# Patient Record
Sex: Female | Born: 1945 | Race: White | Hispanic: No | Smoking: Former smoker
Health system: Southern US, Community
[De-identification: ages and names within clinical notes are randomized; demographics above are authoritative.]

## PROBLEM LIST (undated history)

## (undated) DIAGNOSIS — G43909 Migraine, unspecified, not intractable, without status migrainosus: Secondary | ICD-10-CM

## (undated) DIAGNOSIS — J45909 Unspecified asthma, uncomplicated: Secondary | ICD-10-CM

## (undated) DIAGNOSIS — Z9289 Personal history of other medical treatment: Secondary | ICD-10-CM

## (undated) DIAGNOSIS — E039 Hypothyroidism, unspecified: Secondary | ICD-10-CM

## (undated) HISTORY — PX: INCONTINENCE SURGERY: SHX676

## (undated) HISTORY — PX: TOTAL SHOULDER ARTHROPLASTY: SHX126

## (undated) HISTORY — DX: Unspecified asthma, uncomplicated: J45.909

## (undated) HISTORY — PX: BASAL CELL CARCINOMA EXCISION: SHX1214

## (undated) HISTORY — DX: Hypothyroidism, unspecified: E03.9

## (undated) HISTORY — DX: Migraine, unspecified, not intractable, without status migrainosus: G43.909

## (undated) HISTORY — DX: Personal history of other medical treatment: Z92.89

---

## 1991-12-11 DIAGNOSIS — Z90710 Acquired absence of both cervix and uterus: Secondary | ICD-10-CM

## 1991-12-11 HISTORY — DX: Acquired absence of both cervix and uterus: Z90.710

## 2011-12-11 DIAGNOSIS — Z96641 Presence of right artificial hip joint: Secondary | ICD-10-CM

## 2011-12-11 HISTORY — DX: Presence of right artificial hip joint: Z96.641

## 2013-12-10 DIAGNOSIS — Z9289 Personal history of other medical treatment: Secondary | ICD-10-CM

## 2013-12-10 HISTORY — DX: Personal history of other medical treatment: Z92.89

## 2014-12-10 DIAGNOSIS — Z9889 Other specified postprocedural states: Secondary | ICD-10-CM

## 2014-12-10 HISTORY — DX: Other specified postprocedural states: Z98.890

## 2016-12-10 DIAGNOSIS — Z96642 Presence of left artificial hip joint: Secondary | ICD-10-CM

## 2016-12-10 HISTORY — DX: Presence of left artificial hip joint: Z96.642

## 2019-12-11 DIAGNOSIS — Z9289 Personal history of other medical treatment: Secondary | ICD-10-CM

## 2019-12-11 HISTORY — DX: Personal history of other medical treatment: Z92.89

## 2021-01-05 ENCOUNTER — Ambulatory Visit: Payer: Self-pay | Admitting: Internal Medicine

## 2021-04-06 ENCOUNTER — Other Ambulatory Visit: Payer: Self-pay | Admitting: Family Medicine

## 2021-04-06 DIAGNOSIS — Z1231 Encounter for screening mammogram for malignant neoplasm of breast: Secondary | ICD-10-CM

## 2021-04-17 ENCOUNTER — Ambulatory Visit: Payer: No Typology Code available for payment source | Admitting: Family Medicine

## 2021-04-19 ENCOUNTER — Encounter: Payer: Self-pay | Admitting: Family Medicine

## 2021-04-19 ENCOUNTER — Other Ambulatory Visit: Payer: Self-pay

## 2021-04-19 ENCOUNTER — Ambulatory Visit (INDEPENDENT_AMBULATORY_CARE_PROVIDER_SITE_OTHER): Payer: No Typology Code available for payment source | Admitting: Family Medicine

## 2021-04-19 VITALS — BP 132/82 | Ht 62.0 in | Wt 157.0 lb

## 2021-04-19 DIAGNOSIS — M1711 Unilateral primary osteoarthritis, right knee: Secondary | ICD-10-CM

## 2021-04-19 NOTE — Progress Notes (Signed)
PCP: Sigmund Hazel, MD  Subjective:   HPI: Patient is a 75 y.o. female here for right knee pain.  Patient reports she's had off and on right knee pain for years. Pain is medial. Likes to walk for exercise and does so daily. Pain is an achiness and notices this after sleep also. No benefit from recent steroid injection. Years ago had viscosupplementation which helped - here for consideration of this.  Past Medical History:  Diagnosis Date  . Asthma   . History of bone density study 2015   [facility]  . History of colonoscopy 2016  . History of hysterectomy 1993   W.Ratchford, MD.  . History of left hip replacement 2018   Osvaldo Shipper III, MD  . History of mammogram 2021   [facility]  . History of Papanicolaou smear of cervix    2021  . History of right hip replacement 2013   Mal Amabile, MD  . Hypothyroid   . Migraine syndrome     Current Outpatient Medications on File Prior to Visit  Medication Sig Dispense Refill  . amLODipine (NORVASC) 5 MG tablet Take 5 mg by mouth daily.    . Ascorbic Acid (VITAMIN C PO) Take 5,000 mg by mouth daily.    . Cholecalciferol (VITAMIN D3 PO) Take 1,000 Units by mouth daily.    . Coenzyme Q10 (COQ10) 100 MG CAPS Take 1 capsule by mouth daily.    Marland Kitchen erythromycin (ERY-TAB) 250 MG EC tablet Take 250 mg by mouth as needed. Dental Procedures.    Marland Kitchen estradiol (ESTRACE) 0.1 MG/GM vaginal cream Place 1 Applicatorful vaginally.    Marland Kitchen estradiol (VIVELLE-DOT) 0.05 MG/24HR patch Place 1 patch onto the skin.     . Flaxseed, Linseed, (FLAXSEED OIL PO) Take 1,000 mg by mouth daily.    Marland Kitchen levothyroxine (SYNTHROID) 75 MCG tablet Take 75 mcg by mouth daily.    . Magnesium 100 MG CAPS Take 1 capsule by mouth daily.    . montelukast (SINGULAIR) 10 MG tablet Take 10 mg by mouth at bedtime.    . SUMAtriptan (IMITREX) 100 MG tablet Take 100 mg by mouth as needed for migraine. May repeat in 2 hours if headache persists or  recurs.    . Tiotropium Bromide Monohydrate (SPIRIVA HANDIHALER IN) Inhale 1 capsule into the lungs daily.    . Vitamins-Lipotropics (BALANCE B-100 PO) Take 1 capsule by mouth daily.     No current facility-administered medications on file prior to visit.    History reviewed. No pertinent surgical history.  Allergies  Allergen Reactions  . Ace Inhibitors     Lip Numbness, and Face Numbness.  . Codeine     Dizziness, Imbalance.  . Flagyl [Metronidazole]     Dizziness.  Marland Kitchen Penicillins Hives  . Sulfa Antibiotics Hives  . Tetracyclines & Related     Lip numbness.    Social History   Socioeconomic History  . Marital status: Divorced    Spouse name: Not on file  . Number of children: Not on file  . Years of education: Not on file  . Highest education level: Not on file  Occupational History  . Not on file  Tobacco Use  . Smoking status: Former Smoker    Packs/day: 1.50    Types: Cigarettes  . Smokeless tobacco: Never Used  Substance and Sexual Activity  . Alcohol use: Yes    Alcohol/week: 14.0 standard drinks    Types: 14 Glasses of wine per  week    Comment: 25oz of red wine per evening.  . Drug use: Never  . Sexual activity: Not on file  Other Topics Concern  . Not on file  Social History Narrative   Tobacco use, amount per day now: None.   Past tobacco use, amount per day: 1.5 pack.   How many years did you use tobacco: 3   Alcohol use (drinks per week): 14 drinks per week. 25oz of red wine per evening.   Diet: unsure what you're asking.   Do you drink/eat things with caffeine: Very little, Decaf coffee in AM. Occasional tea.   Marital status: Divorced.                                 What year were you married? 1980.   Do you live in a house, apartment, assisted living, condo, trailer, etc.? House.   Is it one or more stories? No - one floor.   How many persons live in your home? Myself.   Do you have pets in your home?( please list) 2 Terrier Mix Dogs, and 1  Cat.   Highest Level of education completed? PhD   Current or past profession: Counsellor.   Do you exercise?  Yes.                                Type and how often? Walking Daily, 7.000 steps per day.   Do you have a living will? Yes.   Do you have a DNR form?  No                                 If not, do you want to discuss one? Yes   Do you have signed POA/HPOA forms? Yes                       If so, please bring to you appointment      Do you have any difficulty bathing or dressing yourself? No.   Do you have any difficulty preparing food or eating? No.   Do you have any difficulty managing your medications? No.   Do you have any difficulty managing your finances? No.   Do you have any difficulty affording your medications? No.   Social Determinants of Health   Financial Resource Strain: Not on file  Food Insecurity: Not on file  Transportation Needs: Not on file  Physical Activity: Not on file  Stress: Not on file  Social Connections: Not on file  Intimate Partner Violence: Not on file    Family History  Problem Relation Age of Onset  . Stroke Father   . Kidney failure Brother   . Heart failure Brother   . Kidney failure Daughter   . Heart attack Paternal Uncle   . Dementia Maternal Grandmother   . Cancer Paternal Grandmother     BP 132/82   Ht 5\' 2"  (1.575 m)   Wt 157 lb (71.2 kg)   BMI 28.72 kg/m   No flowsheet data found.  No flowsheet data found.  Review of Systems: See HPI above.     Objective:  Physical Exam:  Gen: NAD, comfortable in exam room  Right knee: No gross deformity, ecchymoses.  Mild effusion. TTP medial joint line.  No other tenderness. FROM with normal strength. Negative ant/post drawers. Negative valgus/varus testing. Negative lachman.  Negative mcmurrays, apleys.  NV intact distally.   Assessment & Plan:  1. Right knee pain - outside radiographs confirmed arthritis.  Exam consistent with this as cause of pain.   Steroid injection without benefit.  Will go ahead with viscosupplementation and call patient when approved.  OTC medications, topicals if needed.

## 2021-05-22 ENCOUNTER — Other Ambulatory Visit: Payer: Self-pay

## 2021-05-22 ENCOUNTER — Ambulatory Visit (INDEPENDENT_AMBULATORY_CARE_PROVIDER_SITE_OTHER): Payer: No Typology Code available for payment source | Admitting: Family Medicine

## 2021-05-22 ENCOUNTER — Encounter: Payer: Self-pay | Admitting: Family Medicine

## 2021-05-22 DIAGNOSIS — M1711 Unilateral primary osteoarthritis, right knee: Secondary | ICD-10-CM

## 2021-05-22 NOTE — Progress Notes (Signed)
PCP: Sigmund Hazel, MD  Subjective:   HPI: Patient is a Stephanie Brennan here for right knee pain.  5/11: Patient reports she's had off and on right knee pain for years. Pain is medial. Likes to walk for exercise and does so daily. Pain is an achiness and notices this after sleep also. No benefit from recent steroid injection. Years ago had viscosupplementation which helped - here for consideration of this.  6/13: Patient here to start orthovisc series.  Past Medical History:  Diagnosis Date   Asthma    History of bone density study 2015   [facility]   History of colonoscopy 2016   History of hysterectomy 1993   W.Ratchford, MD.   History of left hip replacement 2018   Osvaldo Shipper III, MD   History of mammogram 2021   [facility]   History of Papanicolaou smear of cervix    2021   History of right hip replacement 2013   Mal Amabile, MD   Hypothyroid    Migraine syndrome     Current Outpatient Medications on File Prior to Visit  Medication Sig Dispense Refill   amLODipine (NORVASC) 5 MG tablet Take 5 mg by mouth daily.     Ascorbic Acid (VITAMIN C PO) Take 5,000 mg by mouth daily.     Cholecalciferol (VITAMIN D3 PO) Take 1,000 Units by mouth daily.     Coenzyme Q10 (COQ10) 100 MG CAPS Take 1 capsule by mouth daily.     erythromycin (ERY-TAB) 250 MG EC tablet Take 250 mg by mouth as needed. Dental Procedures.     estradiol (ESTRACE) 0.1 MG/GM vaginal cream Place 1 Applicatorful vaginally.     estradiol (VIVELLE-DOT) 0.05 MG/24HR patch Place 1 patch onto the skin.      Flaxseed, Linseed, (FLAXSEED OIL PO) Take 1,000 mg by mouth daily.     levothyroxine (SYNTHROID) 75 MCG tablet Take 75 mcg by mouth daily.     Magnesium 100 MG CAPS Take 1 capsule by mouth daily.     montelukast (SINGULAIR) 10 MG tablet Take 10 mg by mouth at bedtime.     SUMAtriptan (IMITREX) 100 MG tablet Take 100 mg by mouth as needed for migraine. May repeat  in 2 hours if headache persists or recurs.     Tiotropium Bromide Monohydrate (SPIRIVA HANDIHALER IN) Inhale 1 capsule into the lungs daily.     Vitamins-Lipotropics (BALANCE B-100 PO) Take 1 capsule by mouth daily.     No current facility-administered medications on file prior to visit.    History reviewed. No pertinent surgical history.  Allergies  Allergen Reactions   Ace Inhibitors     Lip Numbness, and Face Numbness.   Codeine     Dizziness, Imbalance.   Flagyl [Metronidazole]     Dizziness.   Penicillins Hives   Sulfa Antibiotics Hives   Tetracyclines & Related     Lip numbness.    Social History   Socioeconomic History   Marital status: Divorced    Spouse name: Not on file   Number of children: Not on file   Years of education: Not on file   Highest education level: Not on file  Occupational History   Not on file  Tobacco Use   Smoking status: Former    Packs/day: 1.50    Pack years: 0.00    Types: Cigarettes   Smokeless tobacco: Never  Substance and Sexual Activity   Alcohol use: Yes  Alcohol/week: 14.0 standard drinks    Types: 14 Glasses of wine per week    Comment: 25oz of red wine per evening.   Drug use: Never   Sexual activity: Not on file  Other Topics Concern   Not on file  Social History Narrative   Tobacco use, amount per day now: None.   Past tobacco use, amount per day: 1.5 pack.   How many years did you use tobacco: 3   Alcohol use (drinks per week): 14 drinks per week. 25oz of red wine per evening.   Diet: unsure what you're asking.   Do you drink/eat things with caffeine: Very little, Decaf coffee in AM. Occasional tea.   Marital status: Divorced.                                 What year were you married? 1980.   Do you live in a house, apartment, assisted living, condo, trailer, etc.? House.   Is it one or more stories? No - one floor.   How many persons live in your home? Myself.   Do you have pets in your home?( please list) 2  Terrier Mix Dogs, and 1 Cat.   Highest Level of education completed? PhD   Current or past profession: Counsellor.   Do you exercise?  Yes.                                Type and how often? Walking Daily, 7.000 steps per day.   Do you have a living will? Yes.   Do you have a DNR form?  No                                 If not, do you want to discuss one? Yes   Do you have signed POA/HPOA forms? Yes                       If so, please bring to you appointment      Do you have any difficulty bathing or dressing yourself? No.   Do you have any difficulty preparing food or eating? No.   Do you have any difficulty managing your medications? No.   Do you have any difficulty managing your finances? No.   Do you have any difficulty affording your medications? No.   Social Determinants of Health   Financial Resource Strain: Not on file  Food Insecurity: Not on file  Transportation Needs: Not on file  Physical Activity: Not on file  Stress: Not on file  Social Connections: Not on file  Intimate Partner Violence: Not on file    Family History  Problem Relation Age of Onset   Stroke Father    Kidney failure Brother    Heart failure Brother    Kidney failure Daughter    Heart attack Paternal Uncle    Dementia Maternal Grandmother    Cancer Paternal Grandmother     BP (!) 150/73   Ht 5\' 2"  (1.575 m)   Wt 155 lb (70.3 kg)   BMI 28.35 kg/m   No flowsheet data found.  No flowsheet data found.  Review of Systems: See HPI above.     Objective:  Physical Exam:  Gen: NAD, comfortable in exam room  Remainder of exam not repeated today.  Right knee: No gross deformity, ecchymoses.  Mild effusion. TTP medial joint line.  No other tenderness. FROM with normal strength. Negative ant/post drawers. Negative valgus/varus testing. Negative lachman.  Negative mcmurrays, apleys.  NV intact distally.   Assessment & Plan:  1. Right knee pain - 2/2 arthritis.  Orthovisc  series started today.  F/u in 1 week for second injection.  After informed written consent timeout was performed, patient was seated on exam table. Right knee was prepped with alcohol swab and utilizing anteromedial approach, patient's right knee was injected intraarticularly with 30mL lidocaine followed by orthovisc. Patient tolerated the procedure well without immediate complications.

## 2021-05-30 ENCOUNTER — Other Ambulatory Visit: Payer: Self-pay

## 2021-05-30 ENCOUNTER — Ambulatory Visit
Admission: RE | Admit: 2021-05-30 | Discharge: 2021-05-30 | Disposition: A | Discharge status: Home / Self Care | Payer: No Typology Code available for payment source | Source: Ambulatory Visit | Attending: Family Medicine | Admitting: Family Medicine

## 2021-05-30 DIAGNOSIS — Z1231 Encounter for screening mammogram for malignant neoplasm of breast: Secondary | ICD-10-CM

## 2021-05-31 ENCOUNTER — Ambulatory Visit (INDEPENDENT_AMBULATORY_CARE_PROVIDER_SITE_OTHER): Payer: No Typology Code available for payment source | Admitting: Family Medicine

## 2021-05-31 ENCOUNTER — Ambulatory Visit: Payer: No Typology Code available for payment source | Admitting: Family Medicine

## 2021-05-31 ENCOUNTER — Encounter: Payer: Self-pay | Admitting: Family Medicine

## 2021-05-31 DIAGNOSIS — M1711 Unilateral primary osteoarthritis, right knee: Secondary | ICD-10-CM

## 2021-05-31 NOTE — Progress Notes (Signed)
Stephanie Brennan presents for her second of 3 Orthovisc injections.  She had her first last week with Dr. Pearletha Forge, reports that she is doing fairly well.  She did overdo it a bit in the garden after her last visit and had some increased soreness after that.  No side effects from injection, no new concerns.  Procedure note: After informed written consent timeout was performed, patient was seated on exam table. Right knee was prepped with alcohol swab and utilizing ultrasound-guided suprapatellar approach, patient's right knee was anesthetized with 3 cc of lidocaine and injected intraarticularly with preloaded Orthovisc syringe. Patient tolerated the procedure well without immediate complications.  Please see associated documentation for ultrasound images.  Guy Sandifer, MD Sports Medicine Fellow, [facility]   I was the preceptor for this visit and available for immediate consultation Marsa Aris, DO

## 2021-06-05 ENCOUNTER — Other Ambulatory Visit: Payer: Self-pay

## 2021-06-05 ENCOUNTER — Ambulatory Visit (INDEPENDENT_AMBULATORY_CARE_PROVIDER_SITE_OTHER): Payer: No Typology Code available for payment source | Admitting: Family Medicine

## 2021-06-05 DIAGNOSIS — M1711 Unilateral primary osteoarthritis, right knee: Secondary | ICD-10-CM | POA: Diagnosis not present

## 2021-06-06 ENCOUNTER — Encounter: Payer: Self-pay | Admitting: Family Medicine

## 2021-06-06 NOTE — Progress Notes (Signed)
PCP: Sigmund Hazel, MD  Subjective:   HPI: Patient is a 75 y.o. female here for right knee pain.  5/11: Patient reports she's had off and on right knee pain for years. Pain is medial. Likes to walk for exercise and does so daily. Pain is an achiness and notices this after sleep also. No benefit from recent steroid injection. Years ago had viscosupplementation which helped - here for consideration of this.  6/13: Patient here to start orthovisc series.  6/22: Patient given second orthovisc injection  6/27: Patient returns for third orthovisc injection.  Past Medical History:  Diagnosis Date   Asthma    History of bone density study 2015   [facility]   History of colonoscopy 2016   History of hysterectomy 1993   W.Ratchford, MD.   History of left hip replacement 2018   Osvaldo Shipper III, MD   History of mammogram 2021   [facility]   History of Papanicolaou smear of cervix    2021   History of right hip replacement 2013   Mal Amabile, MD   Hypothyroid    Migraine syndrome     Current Outpatient Medications on File Prior to Visit  Medication Sig Dispense Refill   amlodipine-atorvastatin (CADUET) 2.5-10 MG tablet      amLODipine (NORVASC) 5 MG tablet Take 5 mg by mouth daily.     Ascorbic Acid (VITAMIN C PO) Take 5,000 mg by mouth daily.     Cholecalciferol (VITAMIN D3 PO) Take 1,000 Units by mouth daily.     Coenzyme Q10 (COQ10) 100 MG CAPS Take 1 capsule by mouth daily.     erythromycin (ERY-TAB) 250 MG EC tablet Take 250 mg by mouth as needed. Dental Procedures.     estradiol (ESTRACE) 0.1 MG/GM vaginal cream Place 1 Applicatorful vaginally.     estradiol (VIVELLE-DOT) 0.025 MG/24HR Place onto the skin.     Flaxseed, Linseed, (FLAXSEED OIL PO) Take 1,000 mg by mouth daily.     levothyroxine (SYNTHROID) 75 MCG tablet Take 75 mcg by mouth daily.     Magnesium 100 MG CAPS Take 1 capsule by mouth daily.     montelukast  (SINGULAIR) 10 MG tablet Take 10 mg by mouth at bedtime.     SUMAtriptan (IMITREX) 100 MG tablet Take 100 mg by mouth as needed for migraine. May repeat in 2 hours if headache persists or recurs.     Tiotropium Bromide Monohydrate (SPIRIVA HANDIHALER IN) Inhale 1 capsule into the lungs daily.     Vitamins-Lipotropics (BALANCE B-100 PO) Take 1 capsule by mouth daily.     No current facility-administered medications on file prior to visit.    History reviewed. No pertinent surgical history.  Allergies  Allergen Reactions   Ace Inhibitors     Lip Numbness, and Face Numbness.   Codeine     Dizziness, Imbalance.   Flagyl [Metronidazole]     Dizziness.   Penicillins Hives   Sulfa Antibiotics Hives   Tetracyclines & Related     Lip numbness.    Social History   Socioeconomic History   Marital status: Divorced    Spouse name: Not on file   Number of children: Not on file   Years of education: Not on file   Highest education level: Not on file  Occupational History   Not on file  Tobacco Use   Smoking status: Former    Packs/day: 1.50    Pack years: 0.00  Types: Cigarettes   Smokeless tobacco: Never  Substance and Sexual Activity   Alcohol use: Yes    Alcohol/week: 14.0 standard drinks    Types: 14 Glasses of wine per week    Comment: 25oz of red wine per evening.   Drug use: Never   Sexual activity: Not on file  Other Topics Concern   Not on file  Social History Narrative   Tobacco use, amount per day now: None.   Past tobacco use, amount per day: 1.5 pack.   How many years did you use tobacco: 3   Alcohol use (drinks per week): 14 drinks per week. 25oz of red wine per evening.   Diet: unsure what you're asking.   Do you drink/eat things with caffeine: Very little, Decaf coffee in AM. Occasional tea.   Marital status: Divorced.                                 What year were you married? 1980.   Do you live in a house, apartment, assisted living, condo, trailer,  etc.? House.   Is it one or more stories? No - one floor.   How many persons live in your home? Myself.   Do you have pets in your home?( please list) 2 Terrier Mix Dogs, and 1 Cat.   Highest Level of education completed? PhD   Current or past profession: Counsellor.   Do you exercise?  Yes.                                Type and how often? Walking Daily, 7.000 steps per day.   Do you have a living will? Yes.   Do you have a DNR form?  No                                 If not, do you want to discuss one? Yes   Do you have signed POA/HPOA forms? Yes                       If so, please bring to you appointment      Do you have any difficulty bathing or dressing yourself? No.   Do you have any difficulty preparing food or eating? No.   Do you have any difficulty managing your medications? No.   Do you have any difficulty managing your finances? No.   Do you have any difficulty affording your medications? No.   Social Determinants of Health   Financial Resource Strain: Not on file  Food Insecurity: Not on file  Transportation Needs: Not on file  Physical Activity: Not on file  Stress: Not on file  Social Connections: Not on file  Intimate Partner Violence: Not on file    Family History  Problem Relation Age of Onset   Stroke Father    Kidney failure Brother    Heart failure Brother    Kidney failure Daughter    Heart attack Paternal Uncle    Dementia Maternal Grandmother    Cancer Paternal Grandmother     Ht 5\' 2"  (1.575 m)   Wt 155 lb (70.3 kg)   BMI 28.35 kg/m   No flowsheet data found.  No flowsheet data found.  Review of Systems: See HPI above.  Objective:  Physical Exam:  Gen: NAD, comfortable in exam room  Remainder of exam not repeated today.  Right knee: No gross deformity, ecchymoses.  Mild effusion. TTP medial joint line.  No other tenderness. FROM with normal strength. Negative ant/post drawers. Negative valgus/varus testing.  Negative lachman.  Negative mcmurrays, apleys.  NV intact distally.   Assessment & Plan:  1. Right knee pain - 2/2 arthritis.  Third orthovisc injection given today.  Will hold the fourth injection for if not improving going forward.    After informed written consent timeout was performed, patient was seated on exam table. Right knee was prepped with alcohol swab and utilizing anteromedial approach, patient's right knee was injected intraarticularly with 32mL lidocaine followed by orthovisc. Patient tolerated the procedure well without immediate complications.

## 2021-06-23 NOTE — Progress Notes (Signed)
Patient referred by Kathyrn Lass, MD for palpitations  Subjective:   Stephanie Brennan, female    DOB: Oct 31, 1946, 75 y.o.   MRN: [id number]   Chief Complaint  Patient presents with   Palpitations   Mitral Valve Prolapse   Loss of Consciousness   New Patient (Initial Visit)     HPI  75 y.o. Caucasian female with hypertension, palpitations  Patient is a retired Investment banker, operational, still holds.  Professor.  She has had frequent palpitations lasting for few seconds to minutes her last several months.  Episodes are occasionally associated with lightheadedness, but has not had syncope.  She denies chest pain, shortness of breath.  Patient has had hypertension, prompting recent increase in amlodipine dose.  She walks 20-30 minutes every day without any symptoms.  She has bilateral knee arthritis, has been on ambulation.  She denies any pain at rest.  Patient reports taking Advil for her knee pain.  She drinks 1 to 2 glasses of wine nightly.   Past Medical History:  Diagnosis Date   Asthma    History of bone density study 2015   [place]   History of colonoscopy 2016   History of hysterectomy 1993   W.Ratchford, MD.   History of left hip replacement 2018   Arta Bruce III, MD   History of mammogram 2021   [place]   History of Papanicolaou smear of cervix    2021   History of right hip replacement 2013   Maylene Roes, MD   Hypothyroid    Migraine syndrome      History reviewed. No pertinent surgical history.   Social History   Tobacco Use  Smoking Status Former   Packs/day: 1.50   Types: Cigarettes  Smokeless Tobacco Never    Social History   Substance and Sexual Activity  Alcohol Use Yes   Alcohol/week: 14.0 standard drinks   Types: 14 Glasses of wine per week   Comment: 25oz of red wine per evening.     Family History  Problem Relation Age of Onset   Stroke Father    Kidney failure Brother    Heart failure  Brother    Kidney failure Daughter    Heart attack Paternal Uncle    Dementia Maternal Grandmother    Cancer Paternal Grandmother      Current Outpatient Medications on File Prior to Visit  Medication Sig Dispense Refill   amLODipine (NORVASC) 2.5 MG tablet amlodipine 2.5 mg tablet     amLODipine (NORVASC) 5 MG tablet Take 5 mg by mouth daily.     Ascorbic Acid (VITAMIN C PO) Take 5,000 mg by mouth daily.     Cholecalciferol (VITAMIN D3 PO) Take 1,000 Units by mouth daily.     Coenzyme Q10 (COQ10) 100 MG CAPS Take 1 capsule by mouth daily.     erythromycin (ERY-TAB) 250 MG EC tablet Take 250 mg by mouth as needed. Dental Procedures.     estradiol (ESTRACE) 0.1 MG/GM vaginal cream Place 1 Applicatorful vaginally.     estradiol (VIVELLE-DOT) 0.025 MG/24HR Place onto the skin.     Flaxseed, Linseed, (FLAXSEED OIL PO) Take 1,000 mg by mouth daily.     ibuprofen (ADVIL) 600 MG tablet Take 600 mg by mouth every 6 (six) hours as needed.     levothyroxine (SYNTHROID) 75 MCG tablet Take 75 mcg by mouth daily.     Magnesium 100 MG CAPS Take 1 capsule by mouth daily.  montelukast (SINGULAIR) 10 MG tablet Take 10 mg by mouth at bedtime.     SUMAtriptan (IMITREX) 100 MG tablet Take 100 mg by mouth as needed for migraine. May repeat in 2 hours if headache persists or recurs.     Tiotropium Bromide Monohydrate (SPIRIVA HANDIHALER IN) Inhale 1 capsule into the lungs daily.     Vitamins-Lipotropics (BALANCE B-100 PO) Take 1 capsule by mouth daily.     No current facility-administered medications on file prior to visit.    Cardiovascular and other pertinent studies:   EKG 06/26/2021: Sinus rhythm 65 bpm Low voltage in precordial leads Old anteroseptal infarct     Recent labs: 11/14/2020: Glucose 85, BUN/Cr 25/0.8. EGFR 67. HbA1C N/A Chol 224, TG 179, HDL 82, LDL 112 TSH 1.3 normal    Review of Systems  Cardiovascular:  Positive for palpitations. Negative for chest pain, dyspnea on  exertion, leg swelling and syncope.        Vitals:   06/26/21 1421 06/26/21 1422  BP: (!) 164/90 (!) 170/90  Pulse: 75 72  Resp:    Temp:    SpO2: 99%      Body mass index is 29.23 kg/m. Filed Weights   06/26/21 1411  Weight: 159 lb 12.8 oz (72.5 kg)     Objective:   Physical Exam Vitals and nursing note reviewed.  Constitutional:      General: She is not in acute distress. Neck:     Vascular: No JVD.  Cardiovascular:     Rate and Rhythm: Normal rate and regular rhythm.     Pulses: Normal pulses.     Heart sounds: Normal heart sounds. No murmur heard. Pulmonary:     Effort: Pulmonary effort is normal.     Breath sounds: Normal breath sounds. No wheezing or rales.  Musculoskeletal:     Right lower leg: No edema.     Left lower leg: No edema.        Assessment & Recommendations:    75 y.o. Caucasian female with hypertension, palpitations  Palpitations: Likely benign ectopy.  Will obtain echocardiogram to rule out structural abnormality, given history of hypertension.  She is contemplating purchasing an apple watch.  If she purchases apple watch, I will monitor for arrhythmias on cardiologs.  If she does not have apple watch by the time of echocardiogram, I will then place her on a 2-week cardiac telemetry.  Hypertension: Discussed low-salt diet and continued physical activity and exercise.  Encourage patient to maintain blood pressure log at home and bring to her next visit.  I encouraged her to cut down on use of NSAIDs and alcohol.  Further recommendations after above testing.  Thank you for referring the patient to [place]. Please feel free to contact with any questions.   Nigel Mormon, MD Pager: [phone] Office: [phone]

## 2021-06-26 ENCOUNTER — Encounter: Payer: Self-pay | Admitting: Cardiology

## 2021-06-26 ENCOUNTER — Other Ambulatory Visit: Payer: Self-pay

## 2021-06-26 ENCOUNTER — Ambulatory Visit: Payer: No Typology Code available for payment source | Admitting: Cardiology

## 2021-06-26 VITALS — BP 170/90 | HR 72 | Temp 97.7°F | Resp 16 | Ht 62.0 in | Wt 159.8 lb

## 2021-06-26 DIAGNOSIS — I10 Essential (primary) hypertension: Secondary | ICD-10-CM | POA: Insufficient documentation

## 2021-06-26 DIAGNOSIS — R002 Palpitations: Secondary | ICD-10-CM | POA: Insufficient documentation

## 2021-07-04 ENCOUNTER — Other Ambulatory Visit: Payer: Self-pay

## 2021-07-04 DIAGNOSIS — I10 Essential (primary) hypertension: Secondary | ICD-10-CM

## 2021-07-04 NOTE — Progress Notes (Signed)
Pcv echo

## 2021-07-06 ENCOUNTER — Ambulatory Visit: Payer: No Typology Code available for payment source

## 2021-07-06 ENCOUNTER — Other Ambulatory Visit: Payer: Self-pay

## 2021-07-06 DIAGNOSIS — I10 Essential (primary) hypertension: Secondary | ICD-10-CM

## 2021-07-17 ENCOUNTER — Encounter: Payer: Self-pay | Admitting: Cardiology

## 2021-07-17 ENCOUNTER — Other Ambulatory Visit: Payer: Self-pay

## 2021-07-17 ENCOUNTER — Ambulatory Visit: Payer: No Typology Code available for payment source | Admitting: Cardiology

## 2021-07-17 VITALS — BP 153/76 | HR 63 | Temp 97.6°F | Resp 17 | Ht 62.0 in | Wt 158.4 lb

## 2021-07-17 DIAGNOSIS — I351 Nonrheumatic aortic (valve) insufficiency: Secondary | ICD-10-CM | POA: Insufficient documentation

## 2021-07-17 DIAGNOSIS — I10 Essential (primary) hypertension: Secondary | ICD-10-CM

## 2021-07-17 DIAGNOSIS — R002 Palpitations: Secondary | ICD-10-CM

## 2021-07-17 MED ORDER — METOPROLOL TARTRATE 25 MG PO TABS: 12.5000 mg | ORAL_TABLET | Freq: Two times a day (BID) | ORAL | 3 refills | Status: AC

## 2021-07-17 NOTE — Progress Notes (Signed)
Patient referred by Kathyrn Lass, MD for palpitations  Subjective:   Stephanie Brennan, female    DOB: 1946/07/19, 75 y.o.   MRN: [id number]   Chief Complaint  Patient presents with   Hypertension   Follow-up    3 WEEKS     HPI  75 y.o. Caucasian female with hypertension, palpitations  Palpitations have persisted. Patient now has an Apple watch, but has not been able to capture an apple watch, symptoms are very short lasting-typically for a second or 2.  Blood pressure is elevated today.  She has noticed variable blood pressure range.  Resting blood pressure has improved after increasing amlodipine to 10 mg daily.  Reviewed recent echocardiogram results with the patient, details below.  Initial consultation HPI 06/2021: Patient is a retired Investment banker, operational, still holds the role as a Professor.  She has had frequent palpitations lasting for few seconds to minutes her last several months.  Episodes are occasionally associated with lightheadedness, but has not had syncope.  She denies chest pain, shortness of breath.  Patient has had hypertension, prompting recent increase in amlodipine dose.  She walks 20-30 minutes every day without any symptoms.  She has bilateral knee arthritis, has been on ambulation.  She denies any pain at rest.  Patient reports taking Advil for her knee pain.  She drinks 1 to 2 glasses of wine nightly.   Current Outpatient Medications on File Prior to Visit  Medication Sig Dispense Refill   amLODipine (NORVASC) 10 MG tablet Take 10 mg by mouth daily.     Ascorbic Acid (VITAMIN C PO) Take 5,000 mg by mouth daily.     Cholecalciferol (VITAMIN D3 PO) Take 1,000 Units by mouth daily.     Coenzyme Q10 (COQ10) 100 MG CAPS Take 1 capsule by mouth daily.     erythromycin (ERY-TAB) 250 MG EC tablet Take 250 mg by mouth as needed. Dental Procedures.     estradiol (ESTRACE) 0.1 MG/GM vaginal cream Place 1 Applicatorful vaginally.     estradiol (VIVELLE-DOT) 0.025 MG/24HR  Place onto the skin.     ibuprofen (ADVIL) 600 MG tablet Take 600 mg by mouth every 6 (six) hours as needed.     levothyroxine (SYNTHROID) 75 MCG tablet Take 75 mcg by mouth daily.     Magnesium 100 MG CAPS Take 1 capsule by mouth daily.     montelukast (SINGULAIR) 10 MG tablet Take 10 mg by mouth at bedtime.     Omega-3 1000 MG CAPS Take 1 capsule by mouth daily.     rosuvastatin (CRESTOR) 5 MG tablet Take 5 mg by mouth daily.     SUMAtriptan (IMITREX) 100 MG tablet Take 100 mg by mouth as needed for migraine. May repeat in 2 hours if headache persists or recurs.     Tiotropium Bromide Monohydrate (SPIRIVA HANDIHALER IN) Inhale 1 capsule into the lungs daily.     Vitamins-Lipotropics (BALANCE B-100 PO) Take 1 capsule by mouth daily.     No current facility-administered medications on file prior to visit.    Cardiovascular and other pertinent studies:  Echocardiogram 07/06/2021:  Left ventricle cavity is normal in size and wall thickness. Normal global  wall motion. Normal LV systolic function with EF 66%. Doppler evidence of  grade I (impaired) diastolic dysfunction, normal LAP.  Structurally normal trileaflet aortic valve. Moderate (Grade II) aortic  regurgitation.  Mild (Grade I) mitral regurgitation.  Mild tricuspid regurgitation.  No evidence of pulmonary hypertension.  EKG 06/26/2021: Sinus  rhythm 65 bpm Low voltage in precordial leads Old anteroseptal infarct     Recent labs: 11/14/2020: Glucose 85, BUN/Cr 25/0.8. EGFR 67. HbA1C N/A Chol 224, TG 179, HDL 82, LDL 112 TSH 1.3 normal    Review of Systems  Cardiovascular:  Positive for palpitations. Negative for chest pain, dyspnea on exertion, leg swelling and syncope.        Vitals:   07/17/21 1356 07/17/21 1405  BP: (!) 156/81 (!) 153/76  Pulse: 68 63  Resp: 17   Temp: 97.6 F (36.4 C)   SpO2: 95% 98%     Body mass index is 28.97 kg/m. Filed Weights   07/17/21 1356  Weight: 158 lb 6.4 oz (71.8 kg)      Objective:   Physical Exam Vitals and nursing note reviewed.  Constitutional:      General: She is not in acute distress. Neck:     Vascular: No JVD.  Cardiovascular:     Rate and Rhythm: Normal rate and regular rhythm.     Pulses: Normal pulses.     Heart sounds: Normal heart sounds. No murmur heard. Pulmonary:     Effort: Pulmonary effort is normal.     Breath sounds: Normal breath sounds. No wheezing or rales.  Musculoskeletal:     Right lower leg: No edema.     Left lower leg: No edema.        Assessment & Recommendations:    75 y.o. Caucasian female with hypertension, palpitations  Palpitations: Likely benign ectopy.  Given persistent symptoms, started metoprolol tartrate 12.5 mg twice daily.  As a byproduct, it may also help her hypertension.  Also enrolled in cardio logs remote monitoring through apple watch.   Hypertension: Continue amlodipine 10 mg daily, started metoprolol tartrate 12.5 mg twice daily, as above   Valvular regurgitation:  Grade 2 aortic regurgitation, mild mitral and tricuspid regurgitation.   Repeat echocardiogram in 1 year   F/u in 3 months     Nigel Mormon, MD Pager: [phone] Office: [phone]

## 2021-09-26 ENCOUNTER — Other Ambulatory Visit: Payer: Self-pay

## 2021-09-26 ENCOUNTER — Encounter: Payer: Self-pay | Admitting: Internal Medicine

## 2021-09-26 ENCOUNTER — Ambulatory Visit (INDEPENDENT_AMBULATORY_CARE_PROVIDER_SITE_OTHER): Payer: No Typology Code available for payment source | Admitting: Internal Medicine

## 2021-09-26 VITALS — BP 130/78 | HR 72 | Temp 97.8°F | Ht 62.0 in | Wt 159.0 lb

## 2021-09-26 DIAGNOSIS — J4520 Mild intermittent asthma, uncomplicated: Secondary | ICD-10-CM

## 2021-09-26 DIAGNOSIS — R058 Other specified cough: Secondary | ICD-10-CM

## 2021-09-26 DIAGNOSIS — J310 Chronic rhinitis: Secondary | ICD-10-CM

## 2021-09-26 MED ORDER — ALBUTEROL SULFATE HFA 108 (90 BASE) MCG/ACT IN AERS
2.0000 | INHALATION_SPRAY | Freq: Four times a day (QID) | RESPIRATORY_TRACT | 2 refills | Status: DC | PRN
Start: 2021-09-26 — End: 2021-10-18

## 2021-09-26 MED ORDER — CETIRIZINE HCL 10 MG PO TABS: 10.0000 mg | ORAL_TABLET | Freq: Every day | ORAL | 5 refills | Status: DC

## 2021-09-26 NOTE — Progress Notes (Signed)
Stephanie Brennan    [id number]    May 25, 1946  Primary Care Physician:Dewey, Christell Constant, MD  Referring Physician: Lewis Moccasin, MD [address] [place],  [place] [address] Reason for Consultation: chronic cough Date of Consultation: 09/26/2021  Chief complaint:   Chief Complaint  Patient presents with   Consult    Cough      HPI: Stephanie Brennan is a 75 y.o. with history of hypothyroidism, HTN, osteoarthritis who presents for new patient evaluation of a cough. 2 months ago had URI. Had 3 negative covid tests at home. Took a Z-pack but symptoms have persisted. Cough is productive, yellow/gray. Has taken lozenges and delsym to help with the cough. Cough is worse at night, has been waking her up at night.    She does have chronic rhinorrhea and has been prescribed ipratropium for this by an ENT. Denies post nasal drainage. She does have frequent throat clearing. Is sensitive to nasal sprays due to migraines.   She denies recurrent bronchitis or pneumonia.  No childhood respiratory disease.   She does have seasonal allergies - otc loratidine, montelukast  She has exercised induced dyspnea and takes spiriva once a day and singular.  Has tried other inhaler such as ICS-LABA in the past.  Symbicort has made her feel good in the past but gave laryngeal irritation.  She had PFTs over 10 years ago.  These symptoms are controlled. She has a rescue inhaler and hasn't had to use it. Spiriva is becoming more expensive, wants to know about alternative. She does exercise daily with walking her dogs.   Social history:  Occupation: professor and Counsellor.  Exposures: lives at home alone, dog and cat Smoking history: passive smoke exposure in childhood. Briefly remote tobacco use   Social History   Occupational History   Not on file  Tobacco Use   Smoking status: Former    Packs/day: 1.50    Years: 4.00    Pack years: 6.00    Types: Cigarettes    Quit date: 1980     Years since quitting: 42.8   Smokeless tobacco: Never  Vaping Use   Vaping Use: Never used  Substance and Sexual Activity   Alcohol use: Yes    Alcohol/week: 14.0 standard drinks    Types: 14 Glasses of wine per week    Comment: 25oz of red wine per evening.   Drug use: Never   Sexual activity: Not on file    Relevant family history:  Family History  Problem Relation Age of Onset   Stroke Father    Kidney failure Brother    Heart failure Brother    Kidney failure Daughter    Heart attack Paternal Uncle    Dementia Maternal Grandmother    Cancer Paternal Grandmother     Past Medical History:  Diagnosis Date   Asthma    History of bone density study 2015   [facility]   History of colonoscopy 2016   History of hysterectomy 1993   W.Ratchford, MD.   History of left hip replacement 2018   Osvaldo Shipper III, MD   History of mammogram 2021   [facility]   History of Papanicolaou smear of cervix    2021   History of right hip replacement 2013   Mal Amabile, MD   Hypothyroid    Migraine syndrome     History reviewed. No pertinent surgical history.   Physical Exam:  Blood pressure 130/78, pulse 72, temperature 97.8 F (36.6 C), temperature source Oral, height 5\' 2"  (1.575 m), weight 159 lb (72.1 kg), SpO2 95 %. Gen:      No acute distress ENT:  no nasal polyps, mucus membranes moist, mallampati IV, mild nasal debris Lungs:    No increased respiratory effort, symmetric chest wall excursion, clear to auscultation bilaterally, no wheezes or crackles CV:         Regular rate and rhythm; no murmurs, rubs, or gallops.  No pedal edema Abd:      + bowel sounds; soft, non-tender; no distension MSK: no acute synovitis of DIP or PIP joints, no mechanics hands.  Skin:      Warm and dry; no rashes Neuro: normal speech, no focal facial asymmetry Psych: alert and oriented x3, normal mood and affect   Data Reviewed/Medical Decision  Making:  Independent interpretation of tests: Imaging:   PFTs: I have personally reviewed the patient's PFTs and none on file No flowsheet data found.  Labs:  No results found for: WBC, HGB, HCT, MCV, PLT No results found for: NA, K, CL, CO2    Immunization status:  Immunization History  Administered Date(s) Administered   Fluad Quad(high Dose 65+) 09/12/2021   Influenza-Unspecified 08/11/2020   Pneumococcal-Unspecified 08/10/2004   Unspecified SARS-COV-2 Vaccination 12/02/2019, 12/23/2019, 09/05/2020, 04/30/2021   Zoster, Live 08/10/2009     I reviewed prior external note(s) from cardiology  I reviewed the result(s) of the labs and imaging as noted above.   I have ordered    Assessment:  Chronic cough Exercised induced asthma Chronic, both allergic and non allergic rhinitis  Plan/Recommendations:  Suspect this is post-viral cough that will continue to improve. Recommend she switch antihistamines from loratidine to cetirizine for her rhinitis Increase use of ipratropium nasal spray  Will trial off spiriva for exercise induced asthma and start prn albuterol. LAMA monotherapy generally not recommended for EIB and if she has worsening symptoms can consider  We discussed disease management and progression at length today.    Return to Care: Return in about 3 months (around 12/27/2021).  12/29/2021, MD Pulmonary and Critical Care Medicine [facility] Office:[phone]  CC: Durel Salts, MD

## 2021-09-26 NOTE — Patient Instructions (Addendum)
Please schedule follow up scheduled with myself in 3 months.  If my schedule is not open yet, we will contact you with a reminder closer to that time.  Stop claritin, start zyrtec at bedtime instead   Stop spiriva. We'll see how you do with exercise off this.   Take the albuterol rescue inhaler every 4 to 6 hours as needed for wheezing or shortness of breath. You can also take it 15 minutes before exercise or exertional activity. Side effects include heart racing or pounding, jitters or anxiety. If you have a history of an irregular heart rhythm, it can make this worse. Can also give some patients a hard time sleeping.  To inhale the aerosol using an inhaler, follow these steps:  Remove the protective dust cap from the end of the mouthpiece. If the dust cap was not placed on the mouthpiece, check the mouthpiece for dirt or other objects. Be sure that the canister is fully and firmly inserted in the mouthpiece. 2. If you are using the inhaler for the first time or if you have not used the inhaler in more than 14 days, you will need to prime it. You may also need to prime the inhaler if it has been dropped. Ask your pharmacist or check the manufacturer's information if this happens. To prime the inhaler, shake it well and then press down on the canister 4 times to release 4 sprays into the air, away from your face. Be careful not to get albuterol in your eyes. 3. Shake the inhaler well. 4. Breathe out as completely as possible through your mouth. 4. Hold the canister with the mouthpiece on the bottom, facing you and the canister pointing upward. Place the open end of the mouthpiece into your mouth. Close your lips tightly around the mouthpiece. 6. Breathe in slowly and deeply through the mouthpiece.At the same time, press down once on the container to spray the medication into your mouth. 7. Try to hold your breath for 10 seconds. remove the inhaler, and breathe out slowly. 8. If you were told to  use 2 puffs, wait 1 minute and then repeat steps 3-7. 9. Replace the protective cap on the inhaler. 10. Clean your inhaler regularly. Follow the manufacturer's directions carefully and ask your doctor or pharmacist if you have any questions about cleaning your inhaler.  Check the back of the inhaler to keep track of the total number of doses left on the inhaler.

## 2021-10-17 ENCOUNTER — Ambulatory Visit: Payer: No Typology Code available for payment source | Admitting: Cardiology

## 2021-10-18 ENCOUNTER — Other Ambulatory Visit: Payer: Self-pay

## 2021-10-18 ENCOUNTER — Encounter: Payer: Self-pay | Admitting: Cardiology

## 2021-10-18 ENCOUNTER — Ambulatory Visit: Payer: No Typology Code available for payment source | Admitting: Cardiology

## 2021-10-18 VITALS — BP 135/68 | HR 62 | Temp 98.0°F | Resp 16 | Ht 62.0 in | Wt 158.0 lb

## 2021-10-18 DIAGNOSIS — I351 Nonrheumatic aortic (valve) insufficiency: Secondary | ICD-10-CM

## 2021-10-18 DIAGNOSIS — R002 Palpitations: Secondary | ICD-10-CM

## 2021-10-18 DIAGNOSIS — I10 Essential (primary) hypertension: Secondary | ICD-10-CM

## 2021-10-18 NOTE — Progress Notes (Signed)
Patient referred by Fanny Bien, MD for palpitations  Subjective:   Stephanie Brennan, female    DOB: 1946/11/02, 75 y.o.   MRN: [id number]   Chief Complaint  Patient presents with   Palpitations   Follow-up    3 month     HPI  75 y.o. Caucasian female with hypertension, palpitations  Patient presents for 41-monthfollow-up of palpitations.  At last office visit added Lopressor 12.5 mg p.o. twice daily.  Patient states palpitations have improved, occurring less frequently.  Patient states in the last 1 months she has had 2 brief episodes of palpitations during which she used apple watch to do an EKG which noted no significant arrhythmia.  She monitors her blood pressure regularly at home and reports home readings averaging 130s/60s mmHg.  She does note she is presently in knee and shoulder pain secondary to arthritis.  Current Outpatient Medications on File Prior to Visit  Medication Sig Dispense Refill   amLODipine (NORVASC) 10 MG tablet Take 10 mg by mouth daily.     Ascorbic Acid (VITAMIN C PO) Take 5,000 mg by mouth daily.     cetirizine (ZYRTEC) 10 MG tablet Take 1 tablet (10 mg total) by mouth daily. 30 tablet 5   Cholecalciferol (VITAMIN D3 PO) Take 1,000 Units by mouth daily.     Coenzyme Q10 (COQ10) 100 MG CAPS Take 1 capsule by mouth daily.     erythromycin (ERY-TAB) 250 MG EC tablet Take 250 mg by mouth as needed. Dental Procedures.     estradiol (ESTRACE) 0.1 MG/GM vaginal cream Place 1 Applicatorful vaginally.     estradiol (VIVELLE-DOT) 0.025 MG/24HR Place onto the skin.     ibuprofen (ADVIL) 600 MG tablet Take 600 mg by mouth every 6 (six) hours as needed.     ipratropium (ATROVENT) 0.03 % nasal spray ipratropium bromide 21 mcg (0.03 %) nasal spray     levothyroxine (SYNTHROID) 75 MCG tablet Take 75 mcg by mouth daily.     Magnesium 100 MG CAPS Take 1 capsule by mouth daily.     metoprolol tartrate (LOPRESSOR) 25 MG tablet Take 0.5 tablets (12.5 mg total) by mouth  2 (two) times daily. 60 tablet 3   montelukast (SINGULAIR) 10 MG tablet Take 10 mg by mouth at bedtime.     Omega-3 1000 MG CAPS Take 1 capsule by mouth daily.     rosuvastatin (CRESTOR) 5 MG tablet Take 5 mg by mouth daily.     SPIRIVA HANDIHALER 18 MCG inhalation capsule 1 capsule daily.     SUMAtriptan (IMITREX) 100 MG tablet Take 100 mg by mouth as needed for migraine. May repeat in 2 hours if headache persists or recurs.     Vitamins-Lipotropics (BALANCE B-100 PO) Take 1 capsule by mouth daily.     No current facility-administered medications on file prior to visit.    Cardiovascular and other pertinent studies:  Echocardiogram 07/06/2021:  Left ventricle cavity is normal in size and wall thickness. Normal global  wall motion. Normal LV systolic function with EF 66%. Doppler evidence of  grade I (impaired) diastolic dysfunction, normal LAP.  Structurally normal trileaflet aortic valve. Moderate (Grade II) aortic  regurgitation.  Mild (Grade I) mitral regurgitation.  Mild tricuspid regurgitation.  No evidence of pulmonary hypertension.  EKG 06/26/2021: Sinus rhythm 65 bpm Low voltage in precordial leads Old anteroseptal infarct     Recent labs: 11/14/2020: Glucose 85, BUN/Cr 25/0.8. EGFR 67. HbA1C N/A Chol 224, TG 179, HDL  82, LDL 112 TSH 1.3 normal    Review of Systems  Cardiovascular:  Positive for palpitations (improved). Negative for chest pain, claudication, dyspnea on exertion, leg swelling, near-syncope, orthopnea, paroxysmal nocturnal dyspnea and syncope.  Respiratory:  Negative for shortness of breath.         Vitals:   10/18/21 1412 10/18/21 1432  BP: (!) 158/75 135/68  Pulse: 69 62  Resp:    Temp:    SpO2:       Body mass index is 28.9 kg/m. Filed Weights   10/18/21 1408  Weight: 158 lb (71.7 kg)     Objective:   Physical Exam Vitals reviewed.  Constitutional:      General: She is not in acute distress. HENT:     Head: Normocephalic  and atraumatic.  Neck:     Vascular: No JVD.  Cardiovascular:     Rate and Rhythm: Normal rate and regular rhythm.     Pulses: Normal pulses and intact distal pulses.     Heart sounds: Normal heart sounds, S1 normal and S2 normal. No murmur heard.   No gallop.  Pulmonary:     Effort: Pulmonary effort is normal. No respiratory distress.     Breath sounds: Normal breath sounds. No wheezing, rhonchi or rales.  Musculoskeletal:     Right lower leg: No edema.     Left lower leg: No edema.  Neurological:     Mental Status: She is alert.        Assessment & Recommendations:    75 y.o. Caucasian female with hypertension, palpitations  Palpitations: Likely benign ectopy.   Patient's symptoms have improved, occurring less frequently with addition of Lopressor Continue Lopressor 12.5 mg p.o. twice daily She is presently enrolled in cardio logs remote monitoring through her apple watch, no significant arrhythmias noted  Hypertension: Initially elevated in the office today, however well controlled upon recheck and on home monitoring. Continue amlodipine and Lopressor  Valvular regurgitation:  Grade 2 aortic regurgitation, mild mitral and tricuspid regurgitation.   We will plan to repeat echocardiogram in 07/2022  Patient is otherwise stable from a cardiovascular standpoint.  Follow-up in 1 year, sooner if needed.   Alethia Berthold, PA-C 10/18/2021, 3:33 PM Office: [phone]

## 2021-12-21 ENCOUNTER — Ambulatory Visit: Payer: No Typology Code available for payment source | Admitting: Internal Medicine

## 2022-01-17 ENCOUNTER — Ambulatory Visit
Admission: RE | Admit: 2022-01-17 | Discharge: 2022-01-17 | Disposition: A | Discharge status: Home / Self Care | Payer: No Typology Code available for payment source | Source: Ambulatory Visit | Attending: Sports Medicine | Admitting: Sports Medicine

## 2022-01-17 ENCOUNTER — Other Ambulatory Visit: Payer: Self-pay | Admitting: Sports Medicine

## 2022-01-17 DIAGNOSIS — M25512 Pain in left shoulder: Secondary | ICD-10-CM

## 2022-01-17 DIAGNOSIS — M25511 Pain in right shoulder: Secondary | ICD-10-CM

## 2022-01-31 ENCOUNTER — Other Ambulatory Visit: Payer: Self-pay | Admitting: Sports Medicine

## 2022-01-31 DIAGNOSIS — M25511 Pain in right shoulder: Secondary | ICD-10-CM

## 2022-02-11 ENCOUNTER — Ambulatory Visit
Admission: RE | Admit: 2022-02-11 | Discharge: 2022-02-11 | Disposition: A | Discharge status: Home / Self Care | Payer: No Typology Code available for payment source | Source: Ambulatory Visit | Attending: Sports Medicine | Admitting: Sports Medicine

## 2022-02-11 ENCOUNTER — Other Ambulatory Visit: Payer: Self-pay

## 2022-02-11 DIAGNOSIS — M25511 Pain in right shoulder: Secondary | ICD-10-CM

## 2022-02-26 ENCOUNTER — Encounter: Payer: Self-pay | Admitting: Cardiology

## 2022-02-26 ENCOUNTER — Telehealth: Payer: Self-pay | Admitting: Nurse Practitioner

## 2022-02-26 NOTE — Telephone Encounter (Signed)
Fax received from Dr. Ramond Marrow with [facility] to perform a Reverse right total shoulder replacement on patient.  Patient needs surgery clearance. Patient was seen on 09/26/2021 and has been scheduled for 03/05/22 with Katie. Office protocol is a risk assessment can be sent to surgeon if patient has been seen in 60 days or less.  ? ?Sending to [facility] for risk assessment or recommendations if patient needs to be seen in office prior to surgical procedure.   ?

## 2022-03-05 ENCOUNTER — Encounter: Payer: Self-pay | Admitting: Nurse Practitioner

## 2022-03-05 ENCOUNTER — Ambulatory Visit (INDEPENDENT_AMBULATORY_CARE_PROVIDER_SITE_OTHER): Payer: No Typology Code available for payment source | Admitting: Nurse Practitioner

## 2022-03-05 ENCOUNTER — Other Ambulatory Visit: Payer: Self-pay

## 2022-03-05 ENCOUNTER — Ambulatory Visit (INDEPENDENT_AMBULATORY_CARE_PROVIDER_SITE_OTHER): Payer: No Typology Code available for payment source

## 2022-03-05 VITALS — BP 122/68 | HR 68 | Ht 62.0 in | Wt 159.0 lb

## 2022-03-05 DIAGNOSIS — Z01811 Encounter for preprocedural respiratory examination: Secondary | ICD-10-CM | POA: Insufficient documentation

## 2022-03-05 DIAGNOSIS — J4520 Mild intermittent asthma, uncomplicated: Secondary | ICD-10-CM | POA: Insufficient documentation

## 2022-03-05 NOTE — Telephone Encounter (Signed)
Ov notes and clearance form have been faxed back to [employer]. Nothing further needed at this time. ?

## 2022-03-05 NOTE — Progress Notes (Addendum)
@Patient  ID: Stephanie Brennan, female    DOB: Apr 27, 1946, 76 y.o.   MRN: [id number]  Chief Complaint  Patient presents with   Follow-up    Surgical clearance - total right shoulder    Referring provider: Lewis Moccasin, MD  HPI: 76 year old female, former remote smoker followed for mild intermittent asthma. She is a patient of Dr. Humphrey Rolls and last seen in office on 09/26/2021. Past medical history significant for HTN, aortic valve insufficiency, OA.   TEST/EVENTS:  07/06/2021 echocardiogram: EF 66% with normal LV size. G1DD. GII AR. GI MR. Mild tricuspid regurgitation. No evidence of pulmonary HTN.   09/26/2021: OV with Dr. Celine Mans. Maintained on loratidine and singulair for seasonal allergies. She was previously put on Spiriva for exercise induced dyspnea. Tried on ICS/LABA in past but had issues with laryngeal irritation. Did report a cough - suspected to be post viral. Advised to switch from loratidine to cetirizine for her rhinitis. Increased ipratropium nasal spray. Encouraged to trial off Spiriva and use PRN albuterol for exercise induced bronchospasm.   03/05/2022: Today - surgical clearance  Patient presents today for surgical clearance prior to right total shoulder which is scheduled for next week, April 5th with Dr. Everardo Pacific. She reports that her breathing has been stable. She stopped her albuterol because she feels like it didn't really help and she has started back on Spiriva; feels like this works well for her. No acute flares in symptoms. She has felt really well and is excited about her procedure coming up.    Allergies  Allergen Reactions   Ace Inhibitors     Lip Numbness, and Face Numbness.   Codeine     Dizziness, Imbalance.   Flagyl [Metronidazole]     Dizziness.   Penicillins Hives   Sulfa Antibiotics Hives   Tetracyclines & Related     Lip numbness.    Immunization History  Administered Date(s) Administered   Fluad Quad(high Dose 65+) 09/12/2021    Influenza-Unspecified 08/11/2020   Pneumococcal-Unspecified 08/10/2004   Unspecified SARS-COV-2 Vaccination 12/02/2019, 12/23/2019, 09/05/2020, 04/30/2021   Zoster, Live 08/10/2009    Past Medical History:  Diagnosis Date   Asthma    History of bone density study 2015   [facility]   History of colonoscopy 2016   History of hysterectomy 1993   W.Ratchford, MD.   History of left hip replacement 2018   Osvaldo Shipper III, MD   History of mammogram 2021   [facility]   History of Papanicolaou smear of cervix    2021   History of right hip replacement 2013   Mal Amabile, MD   Hypothyroid    Migraine syndrome     Tobacco History: Social History   Tobacco Use  Smoking Status Former   Packs/day: 1.50   Years: 4.00   Pack years: 6.00   Types: Cigarettes   Quit date: 1980   Years since quitting: 43.2  Smokeless Tobacco Never   Counseling given: Not Answered   Outpatient Medications Prior to Visit  Medication Sig Dispense Refill   amLODipine (NORVASC) 10 MG tablet Take 10 mg by mouth daily.     Ascorbic Acid (VITAMIN C PO) Take 5,000 mg by mouth daily.     cetirizine (ZYRTEC) 10 MG tablet Take 1 tablet (10 mg total) by mouth daily. 30 tablet 5   Cholecalciferol (VITAMIN D3 PO) Take 1,000 Units by mouth daily.     Coenzyme Q10 (COQ10) 100 MG CAPS Take  1 capsule by mouth daily.     erythromycin (ERY-TAB) 250 MG EC tablet Take 250 mg by mouth as needed. Dental Procedures.     estradiol (ESTRACE) 0.1 MG/GM vaginal cream Place 1 Applicatorful vaginally.     ibuprofen (ADVIL) 600 MG tablet Take 600 mg by mouth every 6 (six) hours as needed.     levothyroxine (SYNTHROID) 75 MCG tablet Take 75 mcg by mouth daily.     Magnesium 100 MG CAPS Take 1 capsule by mouth daily.     montelukast (SINGULAIR) 10 MG tablet Take 10 mg by mouth at bedtime.     Omega-3 1000 MG CAPS Take 1 capsule by mouth daily.     SPIRIVA HANDIHALER 18 MCG inhalation  capsule 1 capsule daily.     SUMAtriptan (IMITREX) 100 MG tablet Take 100 mg by mouth as needed for migraine. May repeat in 2 hours if headache persists or recurs.     Vitamins-Lipotropics (BALANCE B-100 PO) Take 1 capsule by mouth daily.     estradiol (VIVELLE-DOT) 0.025 MG/24HR Place onto the skin.     ipratropium (ATROVENT) 0.03 % nasal spray ipratropium bromide 21 mcg (0.03 %) nasal spray (Patient not taking: Reported on 03/05/2022)     metoprolol tartrate (LOPRESSOR) 25 MG tablet Take 0.5 tablets (12.5 mg total) by mouth 2 (two) times daily. 60 tablet 3   rosuvastatin (CRESTOR) 5 MG tablet Take 5 mg by mouth daily. (Patient not taking: Reported on 03/05/2022)     No facility-administered medications prior to visit.     Review of Systems:   Constitutional: No weight loss or gain, night sweats, fevers, chills, fatigue, or lassitude. HEENT: No headaches, difficulty swallowing, tooth/dental problems, or sore throat. No sneezing, itching, ear ache, nasal congestion, or post nasal drip CV:  No chest pain, orthopnea, PND, swelling in lower extremities, anasarca, dizziness, palpitations, syncope Resp: No shortness of breath with exertion or at rest. No excess mucus or change in color of mucus. No productive or non-productive. No hemoptysis. No wheezing.  No chest wall deformity Skin: No rash, lesions, ulcerations MSK:  +R shoulder pain. No joint swelling.  No decreased range of motion.  No back pain. Neuro: No dizziness or lightheadedness.  Psych: No depression or anxiety. Mood stable.     Physical Exam:  BP 122/68 (BP Location: Left Arm, Cuff Size: Normal)   Pulse 68   Ht 5\' 2"  (1.575 m)   Wt 159 lb (72.1 kg)   SpO2 99%   BMI 29.08 kg/m   GEN: Pleasant, interactive, well-appearing; in no acute distress. HEENT:  Normocephalic and atraumatic. PERRLA. Sclera white. Nasal turbinates pink, moist and patent bilaterally. No rhinorrhea present. Oropharynx pink and moist, without exudate or  edema. No lesions, ulcerations, or postnasal drip.  NECK:  Supple w/ fair ROM. No JVD present. Normal carotid impulses w/o bruits. Thyroid symmetrical with no goiter or nodules palpated. No lymphadenopathy.   CV: RRR, no m/r/g, no peripheral edema. Pulses intact, +2 bilaterally. No cyanosis, pallor or clubbing. PULMONARY:  Unlabored, regular breathing. Clear bilaterally A&P w/o wheezes/rales/rhonchi. No accessory muscle use. No dullness to percussion. GI: BS present and normoactive. Soft, non-tender to palpation. No organomegaly or masses detected. No CVA tenderness. Neuro: A/Ox3. No focal deficits noted.   Skin: Warm, no lesions or rashe Psych: Normal affect and behavior. Judgement and thought content appropriate.     Lab Results:  CBC No results found for: WBC, RBC, HGB, HCT, PLT, MCV, MCH, MCHC, RDW, LYMPHSABS, MONOABS,  EOSABS, BASOSABS  BMET No results found for: NA, K, CL, CO2, GLUCOSE, BUN, CREATININE, CALCIUM, GFRNONAA, GFRAA  BNP No results found for: BNP   Imaging:  MR SHOULDER RIGHT WO CONTRAST  Result Date: 02/12/2022 CLINICAL DATA:  Acute right shoulder pain. Evaluate for rotator cuff injury. Fall 3 weeks ago. EXAM: MRI OF THE RIGHT SHOULDER WITHOUT CONTRAST TECHNIQUE: Multiplanar, multisequence MR imaging of the shoulder was performed. No intravenous contrast was administered. 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 Even these fibers demonstrate partial-thickness tears and high-grade tendinosis. Moderate superior subscapularis tendinosis. The teres minor is intact. Muscles: Moderate to severe anterior supraspinatus and moderate anterior infraspinatus muscle atrophy. Mild fatty infiltration and moderate edema throughout the  infraspinatus muscle. Biceps long head: The intra-articular long head of the biceps tendon is intact. Acromioclavicular Joint: There are mild degenerative changes of the acromioclavicular joint including joint space narrowing, subchondral marrow edema, and peripheral osteophytosis. Type II acromion. Glenohumeral Joint: Moderate glenohumeral joint effusion extending into the subacromial/subdeltoid bursa. Mild-to-moderate thinning of the glenoid and humeral head cartilage. Labrum: Grossly intact, but evaluation is limited by lack of intraarticular fluid. Bones:  No acute fracture. Other: None. IMPRESSION:: IMPRESSION: 1. 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 Moderate to severe anterior supraspinatus and moderate anterior infraspinatus muscle atrophy. 2. Mild degenerative changes of the acromioclavicular joint. 3. Mild-to-moderate thinning of the glenoid and humeral head cartilage. 4. Moderate glenohumeral joint effusion extending into the subacromial/subdeltoid bursa. Electronically Signed   By: Neita Garnet M.D.   On: 02/12/2022 08:02          View : No data to display.          No results found for: NITRICOXIDE      Assessment & Plan:   Mild intermittent asthma Overall stable. Felt better on Spiriva than albuterol PRN. Will continue with LAMA therapy; although, this is not first line monotherapy for asthma. Advised to take inhalers with her to her surgery.   Patient Instructions  Continue Spiriva 1 puff daily  Continue Albuterol inhaler 2 puffs every 6 hours as needed for shortness of breath or wheezing. Notify if symptoms persist despite rescue inhaler/neb use. Continue Zyrtec 10 mg daily for allergies  Continue singulair (montelukast) daily   Take both inhalers with you to surgery. Out of bed as soon as possible with early ambulation. Incentive spirometer every hour while awake.  Follow up in 6 months with Dr.  Celine Mans. If symptoms do not improve or worsen, please contact office for sooner follow up or seek emergency care.    Preoperative respiratory examination Asthma well-controlled; low to moderate risk from pulmonary standpoint. Factors that increase the risk for postoperative pulmonary complications are asthma and age  Respiratory complications generally occur in 1% of ASA Class I patients, 5% of ASA Class II and 10% of ASA Class III-IV patients These complications rarely result in mortality and include postoperative pneumonia, atelectasis, pulmonary embolism, ARDS and increased time requiring postoperative mechanical ventilation.   Overall, I recommend proceeding with the surgery if the risk for respiratory complications are outweighed by the potential benefits. This will need to be discussed between the patient and surgeon.   To reduce risks of respiratory complications, I recommend: --Pre- and post-operative incentive  spirometry performed frequently while awake --Short duration of surgery as much as possible and avoid paralytic if possible --OOB, encourage mobility post-op   1) RISK FOR PROLONGED MECHANICAL VENTILAION - > 48h  1A) Arozullah - Prolonged mech ventilation risk Arozullah Postperative Pulmonary Risk Score - for mech ventilation dependence >48h USAA, Ann Surg 2000, major non-cardiac surgery) Comment Score  Type of surgery - abd ao aneurysm (27), thoracic (21), neurosurgery / upper abdominal / vascular (21), neck (11)  0  Emergency Surgery - (11)  0  ALbumin < 3 or poor nutritional state - (9)  0  BUN > 30 -  (8)  0  Partial or completely dependent functional status - (7)  0  COPD -  (6) asthma 6  Age - 60 to 6 (4), > 70  (6)  6  TOTAL  12  Risk Stratifcation scores  - < 10 (0.5%), 11-19 (1.8%), 20-27 (4.2%), 28-40 (10.1%), >40 (26.6%)  12     I spent 28 minutes of dedicated to the care of this patient on the date of this encounter to include pre-visit review  of records, face-to-face time with the patient discussing conditions above, post visit ordering of testing, clinical documentation with the electronic health record, making appropriate referrals as documented, and communicating necessary findings to members of the patients care team.  Noemi Chapel, NP 03/05/2022  Pt aware and understands NP's role.

## 2022-03-05 NOTE — Assessment & Plan Note (Signed)
Overall stable. Felt better on Spiriva than albuterol PRN. Will continue with LAMA therapy; although, this is not first line monotherapy for asthma. Advised to take inhalers with her to her surgery.  ? ?Patient Instructions  ?Continue Spiriva 1 puff daily  ?Continue Albuterol inhaler 2 puffs every 6 hours as needed for shortness of breath or wheezing. Notify if symptoms persist despite rescue inhaler/neb use. ?Continue Zyrtec 10 mg daily for allergies  ?Continue singulair (montelukast) daily  ? ?Take both inhalers with you to surgery. Out of bed as soon as possible with early ambulation. Incentive spirometer every hour while awake. ? ?Follow up in 6 months with Dr. Celine Mans. If symptoms do not improve or worsen, please contact office for sooner follow up or seek emergency care. ? ? ?

## 2022-03-05 NOTE — Patient Instructions (Addendum)
Continue Spiriva 1 puff daily  ?Continue Albuterol inhaler 2 puffs every 6 hours as needed for shortness of breath or wheezing. Notify if symptoms persist despite rescue inhaler/neb use. ?Continue Zyrtec 10 mg daily for allergies  ?Continue singulair (montelukast) daily  ? ?Take both inhalers with you to surgery. Out of bed as soon as possible with early ambulation. Incentive spirometer every hour while awake. ? ?Follow up in 6 months with Dr. Celine Mans. If symptoms do not improve or worsen, please contact office for sooner follow up or seek emergency care. ?

## 2022-03-05 NOTE — Assessment & Plan Note (Addendum)
Asthma well-controlled; low to moderate risk from pulmonary standpoint. Factors that increase the risk for postoperative pulmonary complications are asthma and age ? ?Respiratory complications generally occur in 1% of ASA Class I patients, 5% of ASA Class II and 10% of ASA Class III-IV patients These complications rarely result in mortality and include postoperative pneumonia, atelectasis, pulmonary embolism, ARDS and increased time requiring postoperative mechanical ventilation. ?  ?Overall, I recommend proceeding with the surgery if the risk for respiratory complications are outweighed by the potential benefits. This will need to be discussed between the patient and surgeon. ?  ?To reduce risks of respiratory complications, I recommend: ?--Pre- and post-operative incentive spirometry performed frequently while awake ?--Short duration of surgery as much as possible and avoid paralytic if possible ?--OOB, encourage mobility post-op ? ? ?1) RISK FOR PROLONGED MECHANICAL VENTILAION - > 48h ? ?1A) Arozullah - Prolonged mech ventilation risk ?Arozullah Postperative Pulmonary Risk Score - for mech ventilation dependence [address] USAA, Ann Surg 2000, major non-cardiac surgery) Comment Score  ?Type of surgery - abd ao aneurysm (27), thoracic (21), neurosurgery / upper abdominal / vascular (21), neck (11)  0  ?Emergency Surgery - (11)  0  ?ALbumin < 3 or poor nutritional state - (9)  0  ?BUN > 30 -  (8)  0  ?Partial or completely dependent functional status - (7)  0  ?COPD -  (6) asthma 6  ?Age - 54 to 10 (4), > 70  (6)  6  ?TOTAL  12  ?Risk Stratifcation scores  - < 10 (0.5%), 11-19 (1.8%), 20-27 (4.2%), 28-40 (10.1%), >40 (26.6%)  12  ? ? ?

## 2022-04-20 ENCOUNTER — Ambulatory Visit (HOSPITAL_COMMUNITY)
Admission: RE | Admit: 2022-04-20 | Discharge: 2022-04-20 | Disposition: A | Discharge status: Home / Self Care | Payer: No Typology Code available for payment source | Source: Ambulatory Visit | Attending: Physician Assistant | Admitting: Physician Assistant

## 2022-04-20 ENCOUNTER — Other Ambulatory Visit: Payer: Self-pay | Admitting: Physician Assistant

## 2022-04-20 DIAGNOSIS — M25561 Pain in right knee: Secondary | ICD-10-CM | POA: Diagnosis not present

## 2022-04-20 DIAGNOSIS — M25461 Effusion, right knee: Secondary | ICD-10-CM | POA: Diagnosis not present

## 2022-04-20 NOTE — Progress Notes (Signed)
Patient called the office stating she was having pain and swelling in the back of her right calf and knee. Due to recent surgery, we will get her scheduled for a STAT doppler to rule out DVT ?

## 2022-04-20 NOTE — Progress Notes (Signed)
Right lower extremity venous duplex completed. ?Refer to "CV Proc" under chart review to view preliminary results. ? ?04/20/2022 5:21 PM ?Eula Fried., MHA, RVT, RDCS, RDMS   ?

## 2022-04-26 ENCOUNTER — Other Ambulatory Visit: Payer: Self-pay | Admitting: Family Medicine

## 2022-04-26 DIAGNOSIS — Z1231 Encounter for screening mammogram for malignant neoplasm of breast: Secondary | ICD-10-CM

## 2022-05-16 DIAGNOSIS — M19011 Primary osteoarthritis, right shoulder: Secondary | ICD-10-CM | POA: Diagnosis not present

## 2022-05-16 DIAGNOSIS — M75121 Complete rotator cuff tear or rupture of right shoulder, not specified as traumatic: Secondary | ICD-10-CM | POA: Diagnosis not present

## 2022-05-16 DIAGNOSIS — M6281 Muscle weakness (generalized): Secondary | ICD-10-CM | POA: Diagnosis not present

## 2022-05-16 DIAGNOSIS — M25611 Stiffness of right shoulder, not elsewhere classified: Secondary | ICD-10-CM | POA: Diagnosis not present

## 2022-05-23 DIAGNOSIS — M75121 Complete rotator cuff tear or rupture of right shoulder, not specified as traumatic: Secondary | ICD-10-CM | POA: Diagnosis not present

## 2022-05-23 DIAGNOSIS — M6281 Muscle weakness (generalized): Secondary | ICD-10-CM | POA: Diagnosis not present

## 2022-05-23 DIAGNOSIS — M25611 Stiffness of right shoulder, not elsewhere classified: Secondary | ICD-10-CM | POA: Diagnosis not present

## 2022-05-23 DIAGNOSIS — M19011 Primary osteoarthritis, right shoulder: Secondary | ICD-10-CM | POA: Diagnosis not present

## 2022-05-30 NOTE — Telephone Encounter (Signed)
From patient.

## 2022-06-01 ENCOUNTER — Ambulatory Visit
Admission: RE | Admit: 2022-06-01 | Discharge: 2022-06-01 | Disposition: A | Discharge status: Home / Self Care | Payer: Medicare Other | Source: Ambulatory Visit | Attending: Family Medicine | Admitting: Family Medicine

## 2022-06-01 DIAGNOSIS — Z1231 Encounter for screening mammogram for malignant neoplasm of breast: Secondary | ICD-10-CM | POA: Diagnosis not present

## 2022-06-06 DIAGNOSIS — M6281 Muscle weakness (generalized): Secondary | ICD-10-CM | POA: Diagnosis not present

## 2022-06-06 DIAGNOSIS — M25611 Stiffness of right shoulder, not elsewhere classified: Secondary | ICD-10-CM | POA: Diagnosis not present

## 2022-06-06 DIAGNOSIS — M19011 Primary osteoarthritis, right shoulder: Secondary | ICD-10-CM | POA: Diagnosis not present

## 2022-06-06 DIAGNOSIS — M75121 Complete rotator cuff tear or rupture of right shoulder, not specified as traumatic: Secondary | ICD-10-CM | POA: Diagnosis not present

## 2022-06-14 DIAGNOSIS — N3946 Mixed incontinence: Secondary | ICD-10-CM | POA: Diagnosis not present

## 2022-06-14 DIAGNOSIS — N3642 Intrinsic sphincter deficiency (ISD): Secondary | ICD-10-CM | POA: Diagnosis not present

## 2022-06-15 DIAGNOSIS — M19011 Primary osteoarthritis, right shoulder: Secondary | ICD-10-CM | POA: Diagnosis not present

## 2022-06-20 DIAGNOSIS — M19011 Primary osteoarthritis, right shoulder: Secondary | ICD-10-CM | POA: Diagnosis not present

## 2022-06-20 DIAGNOSIS — M75121 Complete rotator cuff tear or rupture of right shoulder, not specified as traumatic: Secondary | ICD-10-CM | POA: Diagnosis not present

## 2022-06-20 DIAGNOSIS — M6281 Muscle weakness (generalized): Secondary | ICD-10-CM | POA: Diagnosis not present

## 2022-06-20 DIAGNOSIS — M25611 Stiffness of right shoulder, not elsewhere classified: Secondary | ICD-10-CM | POA: Diagnosis not present

## 2022-06-20 NOTE — Telephone Encounter (Signed)
From patient.

## 2022-06-21 NOTE — Telephone Encounter (Signed)
From patient.

## 2022-06-26 NOTE — Telephone Encounter (Signed)
Order has been extended to 07/28/2022.

## 2022-06-28 DIAGNOSIS — J4540 Moderate persistent asthma, uncomplicated: Secondary | ICD-10-CM | POA: Diagnosis not present

## 2022-06-28 DIAGNOSIS — R519 Headache, unspecified: Secondary | ICD-10-CM | POA: Diagnosis not present

## 2022-06-28 DIAGNOSIS — G43109 Migraine with aura, not intractable, without status migrainosus: Secondary | ICD-10-CM | POA: Diagnosis not present

## 2022-06-28 DIAGNOSIS — J45901 Unspecified asthma with (acute) exacerbation: Secondary | ICD-10-CM | POA: Diagnosis not present

## 2022-07-05 DIAGNOSIS — Z0000 Encounter for general adult medical examination without abnormal findings: Secondary | ICD-10-CM | POA: Diagnosis not present

## 2022-07-05 DIAGNOSIS — R42 Dizziness and giddiness: Secondary | ICD-10-CM | POA: Diagnosis not present

## 2022-07-05 DIAGNOSIS — Z1331 Encounter for screening for depression: Secondary | ICD-10-CM | POA: Diagnosis not present

## 2022-07-05 DIAGNOSIS — Z1339 Encounter for screening examination for other mental health and behavioral disorders: Secondary | ICD-10-CM | POA: Diagnosis not present

## 2022-07-06 ENCOUNTER — Other Ambulatory Visit: Payer: Medicare Other

## 2022-07-10 DIAGNOSIS — E782 Mixed hyperlipidemia: Secondary | ICD-10-CM | POA: Diagnosis not present

## 2022-07-10 DIAGNOSIS — G43909 Migraine, unspecified, not intractable, without status migrainosus: Secondary | ICD-10-CM | POA: Diagnosis not present

## 2022-07-10 DIAGNOSIS — E559 Vitamin D deficiency, unspecified: Secondary | ICD-10-CM | POA: Diagnosis not present

## 2022-07-10 DIAGNOSIS — I10 Essential (primary) hypertension: Secondary | ICD-10-CM | POA: Diagnosis not present

## 2022-07-17 ENCOUNTER — Other Ambulatory Visit: Payer: Medicare Other

## 2022-07-18 DIAGNOSIS — J069 Acute upper respiratory infection, unspecified: Secondary | ICD-10-CM | POA: Diagnosis not present

## 2022-07-18 DIAGNOSIS — E782 Mixed hyperlipidemia: Secondary | ICD-10-CM | POA: Diagnosis not present

## 2022-07-18 DIAGNOSIS — I10 Essential (primary) hypertension: Secondary | ICD-10-CM | POA: Diagnosis not present

## 2022-07-18 DIAGNOSIS — E039 Hypothyroidism, unspecified: Secondary | ICD-10-CM | POA: Diagnosis not present

## 2022-07-19 DIAGNOSIS — M25561 Pain in right knee: Secondary | ICD-10-CM | POA: Diagnosis not present

## 2022-07-27 ENCOUNTER — Ambulatory Visit: Payer: Medicare Other

## 2022-07-27 DIAGNOSIS — B9689 Other specified bacterial agents as the cause of diseases classified elsewhere: Secondary | ICD-10-CM | POA: Diagnosis not present

## 2022-07-27 DIAGNOSIS — J069 Acute upper respiratory infection, unspecified: Secondary | ICD-10-CM | POA: Diagnosis not present

## 2022-07-27 DIAGNOSIS — J40 Bronchitis, not specified as acute or chronic: Secondary | ICD-10-CM | POA: Diagnosis not present

## 2022-07-27 DIAGNOSIS — I351 Nonrheumatic aortic (valve) insufficiency: Secondary | ICD-10-CM

## 2022-07-27 DIAGNOSIS — J329 Chronic sinusitis, unspecified: Secondary | ICD-10-CM | POA: Diagnosis not present

## 2022-07-30 NOTE — Progress Notes (Signed)
No significant change in mild to moderate valve aortic valve leakiness. Repeat echocardiogram in 2 years.  Thanks MJP

## 2022-08-01 NOTE — Progress Notes (Signed)
LVM for patient to return call to discuss above information

## 2022-08-02 DIAGNOSIS — N3946 Mixed incontinence: Secondary | ICD-10-CM | POA: Diagnosis not present

## 2022-08-03 DIAGNOSIS — Z23 Encounter for immunization: Secondary | ICD-10-CM | POA: Diagnosis not present

## 2022-08-07 DIAGNOSIS — D225 Melanocytic nevi of trunk: Secondary | ICD-10-CM | POA: Diagnosis not present

## 2022-08-07 DIAGNOSIS — L578 Other skin changes due to chronic exposure to nonionizing radiation: Secondary | ICD-10-CM | POA: Diagnosis not present

## 2022-08-07 DIAGNOSIS — E039 Hypothyroidism, unspecified: Secondary | ICD-10-CM | POA: Diagnosis not present

## 2022-08-07 DIAGNOSIS — L814 Other melanin hyperpigmentation: Secondary | ICD-10-CM | POA: Diagnosis not present

## 2022-08-07 DIAGNOSIS — D485 Neoplasm of uncertain behavior of skin: Secondary | ICD-10-CM | POA: Diagnosis not present

## 2022-08-07 DIAGNOSIS — C44319 Basal cell carcinoma of skin of other parts of face: Secondary | ICD-10-CM | POA: Diagnosis not present

## 2022-08-07 DIAGNOSIS — R5383 Other fatigue: Secondary | ICD-10-CM | POA: Diagnosis not present

## 2022-08-07 DIAGNOSIS — L821 Other seborrheic keratosis: Secondary | ICD-10-CM | POA: Diagnosis not present

## 2022-08-08 NOTE — Progress Notes (Signed)
2nd attempt : Called patient, NA, LMAM

## 2022-08-09 DIAGNOSIS — N3946 Mixed incontinence: Secondary | ICD-10-CM | POA: Diagnosis not present

## 2022-08-09 DIAGNOSIS — N3642 Intrinsic sphincter deficiency (ISD): Secondary | ICD-10-CM | POA: Diagnosis not present

## 2022-08-09 NOTE — Progress Notes (Signed)
3rd attempt : Called patient, NA, LMAM

## 2022-08-15 DIAGNOSIS — J45909 Unspecified asthma, uncomplicated: Secondary | ICD-10-CM | POA: Diagnosis not present

## 2022-08-15 DIAGNOSIS — E039 Hypothyroidism, unspecified: Secondary | ICD-10-CM | POA: Diagnosis not present

## 2022-08-15 DIAGNOSIS — I10 Essential (primary) hypertension: Secondary | ICD-10-CM | POA: Diagnosis not present

## 2022-08-15 DIAGNOSIS — M170 Bilateral primary osteoarthritis of knee: Secondary | ICD-10-CM | POA: Diagnosis not present

## 2022-08-21 DIAGNOSIS — G4700 Insomnia, unspecified: Secondary | ICD-10-CM | POA: Diagnosis not present

## 2022-08-21 DIAGNOSIS — N952 Postmenopausal atrophic vaginitis: Secondary | ICD-10-CM | POA: Diagnosis not present

## 2022-08-21 DIAGNOSIS — J45909 Unspecified asthma, uncomplicated: Secondary | ICD-10-CM | POA: Diagnosis not present

## 2022-08-21 DIAGNOSIS — I10 Essential (primary) hypertension: Secondary | ICD-10-CM | POA: Diagnosis not present

## 2022-08-22 DIAGNOSIS — C44319 Basal cell carcinoma of skin of other parts of face: Secondary | ICD-10-CM | POA: Diagnosis not present

## 2022-09-05 DIAGNOSIS — Z5189 Encounter for other specified aftercare: Secondary | ICD-10-CM | POA: Diagnosis not present

## 2022-09-05 DIAGNOSIS — C44319 Basal cell carcinoma of skin of other parts of face: Secondary | ICD-10-CM | POA: Diagnosis not present

## 2022-09-17 DIAGNOSIS — Z23 Encounter for immunization: Secondary | ICD-10-CM | POA: Diagnosis not present

## 2022-09-25 DIAGNOSIS — N393 Stress incontinence (female) (male): Secondary | ICD-10-CM | POA: Diagnosis not present

## 2022-09-25 DIAGNOSIS — N3642 Intrinsic sphincter deficiency (ISD): Secondary | ICD-10-CM | POA: Diagnosis not present

## 2022-10-08 DIAGNOSIS — M25561 Pain in right knee: Secondary | ICD-10-CM | POA: Diagnosis not present

## 2022-10-08 DIAGNOSIS — Z01812 Encounter for preprocedural laboratory examination: Secondary | ICD-10-CM | POA: Diagnosis not present

## 2022-10-11 DIAGNOSIS — M19011 Primary osteoarthritis, right shoulder: Secondary | ICD-10-CM | POA: Diagnosis not present

## 2022-10-15 DIAGNOSIS — R262 Difficulty in walking, not elsewhere classified: Secondary | ICD-10-CM | POA: Diagnosis not present

## 2022-10-15 DIAGNOSIS — M6281 Muscle weakness (generalized): Secondary | ICD-10-CM | POA: Diagnosis not present

## 2022-10-15 DIAGNOSIS — M25661 Stiffness of right knee, not elsewhere classified: Secondary | ICD-10-CM | POA: Diagnosis not present

## 2022-10-15 DIAGNOSIS — M1711 Unilateral primary osteoarthritis, right knee: Secondary | ICD-10-CM | POA: Diagnosis not present

## 2022-10-16 DIAGNOSIS — N3000 Acute cystitis without hematuria: Secondary | ICD-10-CM | POA: Diagnosis not present

## 2022-10-17 ENCOUNTER — Ambulatory Visit: Payer: Medicare Other

## 2022-10-17 VITALS — BP 144/79 | HR 73 | Ht 62.0 in | Wt 156.0 lb

## 2022-10-17 DIAGNOSIS — I10 Essential (primary) hypertension: Secondary | ICD-10-CM

## 2022-10-17 DIAGNOSIS — R002 Palpitations: Secondary | ICD-10-CM

## 2022-10-17 MED ORDER — AMLODIPINE BESYLATE 10 MG PO TABS: 10.0000 mg | ORAL_TABLET | Freq: Every day | ORAL | 3 refills | Status: AC

## 2022-10-17 NOTE — Progress Notes (Addendum)
Patient referred by Fanny Bien, MD for palpitations  Subjective:   Stephanie Brennan, female    DOB: 12/28/1945, 76 y.o.   MRN: [id number]  Chief Complaint  Patient presents with   Follow-up    1 year   Hypertension   Palpitations   HPI  76 y.o. Caucasian female with hypertension, palpitations.  Patient presents for 6 month follow-up. At last office visit patient was stable from cardiovascular standpoint and therefore no changes were made. Patient states palpitations have improved, occurring less frequently.  Patient states in the last 6 months months she has had 2 brief episodes of palpitations during which she used apple watch to do an EKG which noted no significant arrhythmia.  She monitors her blood pressure regularly at home and reports home readings averaging 130s/60s mmHg.  She recently underwent a right shoulder replacement and will have partial right knee replacement in December 2023. Overall, she is feeling well without any complaints today.  Current Outpatient Medications on File Prior to Visit  Medication Sig Dispense Refill   amLODipine (NORVASC) 10 MG tablet Take 10 mg by mouth daily.     Ascorbic Acid (VITAMIN C PO) Take 5,000 mg by mouth daily.     Cholecalciferol (VITAMIN D3 PO) Take 1,000 Units by mouth daily.     Coenzyme Q10 (COQ10) 100 MG CAPS Take 1 capsule by mouth daily.     erythromycin (ERY-TAB) 250 MG EC tablet Take 250 mg by mouth as needed. Dental Procedures.     estradiol (ESTRACE) 0.1 MG/GM vaginal cream Place 1 Applicatorful vaginally.     estradiol (VIVELLE-DOT) 0.025 MG/24HR Place onto the skin.     ibuprofen (ADVIL) 600 MG tablet Take 600 mg by mouth every 6 (six) hours as needed.     levothyroxine (SYNTHROID) 75 MCG tablet Take 75 mcg by mouth daily.     Magnesium 100 MG CAPS Take 1 capsule by mouth daily.     metoprolol tartrate (LOPRESSOR) 25 MG tablet Take 0.5 tablets (12.5 mg total) by mouth 2 (two) times daily. 60 tablet 3   montelukast  (SINGULAIR) 10 MG tablet Take 10 mg by mouth at bedtime.     Omega-3 1000 MG CAPS Take 1 capsule by mouth daily.     SPIRIVA HANDIHALER 18 MCG inhalation capsule 1 capsule daily.     SUMAtriptan (IMITREX) 100 MG tablet Take 100 mg by mouth as needed for migraine. May repeat in 2 hours if headache persists or recurs.     Vitamins-Lipotropics (BALANCE B-100 PO) Take 1 capsule by mouth daily.     diclofenac Sodium (VOLTAREN) 1 % GEL Apply 2 g topically 4 (four) times daily.     ezetimibe (ZETIA) 10 MG tablet Take 10 mg by mouth daily.     No current facility-administered medications on file prior to visit.    Cardiovascular and other pertinent studies:  Echocardiogram 07/27/2022:  Left ventricle cavity is normal in size. Mild concentric hypertrophy of  the left ventricle. Normal global wall motion. LV systolic function with  EF 65-70%. Doppler evidence of grade I (impaired) diastolic dysfunction, normal LAP.  Structurally normal trileaflet aortic valve.  Mild to moderate aortic regurgitation. Trace MR, TR.  No evidence of pulmonary hypertension.  No significant change compared to previous study on 07/06/2021.   Vascular US Lower Extremity 04/20/2022: Summary:  RIGHT:  - There is no evidence of deep vein thrombosis in the lower extremity.  - No cystic structure found in the popliteal fossa.  LEFT:  - No evidence of common femoral vein obstruction.   EKG 10/17/2022: Normal sinus rhythm at a rate of 72 bpm.  Normal axis.  Old anteroseptal infarct. No evidence of ischemia or underlying injury pattern.  Compared to previous EKG on 06/27/2019, no significant change.  Recent labs:  08/08/2022: Hemoglobin 14, hematocrit 42.3, MCV 97.5, platelets 286 TSH 0.74  10/08/2022: BUN 28, creatinine 0.78 Hemoglobin 13.2 Potassium 4.7  11/14/2020: Glucose 85, BUN/Cr 25/0.8. EGFR 67 HbA1C N/A Chol 224, TG 179, HDL 82, LDL 112 TSH 1.3 normal   Review of Systems  Cardiovascular:  Positive for  palpitations (stable, not worsening). Negative for chest pain, claudication, dyspnea on exertion, leg swelling, near-syncope, orthopnea, paroxysmal nocturnal dyspnea and syncope.  Respiratory:  Negative for shortness of breath.     Vitals:   10/17/22 1316  BP: (!) 144/79  Pulse: 73  SpO2: 98%    Body mass index is 28.53 kg/m. Filed Weights   10/17/22 1316  Weight: 156 lb (70.8 kg)    Objective:   Physical Exam Vitals reviewed.  Constitutional:      General: She is not in acute distress. HENT:     Head: Normocephalic and atraumatic.  Neck:     Vascular: No JVD.  Cardiovascular:     Rate and Rhythm: Normal rate and regular rhythm.     Pulses: Normal pulses and intact distal pulses.     Heart sounds: Normal heart sounds, S1 normal and S2 normal. No murmur heard.    No gallop.  Pulmonary:     Effort: Pulmonary effort is normal. No respiratory distress.     Breath sounds: Normal breath sounds. No wheezing, rhonchi or rales.  Musculoskeletal:     Right lower leg: No edema.     Left lower leg: No edema.  Neurological:     Mental Status: She is alert.     Assessment & Recommendations:    76 y.o. Caucasian female with hypertension, palpitations  Palpitations: Likely benign ectopy.   Patient's symptoms have improved, occurring less frequently Continue Metoprolol 12.5 mg p.o. twice daily  Hypertension: Initially elevated in the office today, however well controlled upon recheck and on home monitoring. Continue current medications without changes.  Valvular regurgitation:  Reviewed echocardiogram from August 2023, no significant change in mild to moderate aortic valve regurgitation. We will plan to repeat echocardiogram in 2 years.  Reviewed labs from PCP.  Patient is otherwise stable from a cardiovascular standpoint.  Follow-up in 1 year, sooner if needed.   Ernst Spell, AGNP-C 10/17/2022, 1:53 PM Office: [phone] Pager: [phone]

## 2022-10-17 NOTE — Telephone Encounter (Signed)
From patient.

## 2022-10-18 ENCOUNTER — Ambulatory Visit: Payer: Medicare Other | Admitting: Student

## 2022-10-24 DIAGNOSIS — G8918 Other acute postprocedural pain: Secondary | ICD-10-CM | POA: Diagnosis not present

## 2022-10-24 DIAGNOSIS — M1711 Unilateral primary osteoarthritis, right knee: Secondary | ICD-10-CM | POA: Diagnosis not present

## 2022-10-26 DIAGNOSIS — R262 Difficulty in walking, not elsewhere classified: Secondary | ICD-10-CM | POA: Diagnosis not present

## 2022-10-26 DIAGNOSIS — M1711 Unilateral primary osteoarthritis, right knee: Secondary | ICD-10-CM | POA: Diagnosis not present

## 2022-10-26 DIAGNOSIS — M6281 Muscle weakness (generalized): Secondary | ICD-10-CM | POA: Diagnosis not present

## 2022-10-26 DIAGNOSIS — M25661 Stiffness of right knee, not elsewhere classified: Secondary | ICD-10-CM | POA: Diagnosis not present

## 2022-10-29 DIAGNOSIS — M25661 Stiffness of right knee, not elsewhere classified: Secondary | ICD-10-CM | POA: Diagnosis not present

## 2022-10-29 DIAGNOSIS — R262 Difficulty in walking, not elsewhere classified: Secondary | ICD-10-CM | POA: Diagnosis not present

## 2022-10-29 DIAGNOSIS — M6281 Muscle weakness (generalized): Secondary | ICD-10-CM | POA: Diagnosis not present

## 2022-10-29 DIAGNOSIS — M1711 Unilateral primary osteoarthritis, right knee: Secondary | ICD-10-CM | POA: Diagnosis not present

## 2022-11-06 DIAGNOSIS — Z09 Encounter for follow-up examination after completed treatment for conditions other than malignant neoplasm: Secondary | ICD-10-CM | POA: Diagnosis not present

## 2022-11-08 DIAGNOSIS — R262 Difficulty in walking, not elsewhere classified: Secondary | ICD-10-CM | POA: Diagnosis not present

## 2022-11-08 DIAGNOSIS — M25661 Stiffness of right knee, not elsewhere classified: Secondary | ICD-10-CM | POA: Diagnosis not present

## 2022-11-08 DIAGNOSIS — M6281 Muscle weakness (generalized): Secondary | ICD-10-CM | POA: Diagnosis not present

## 2022-11-08 DIAGNOSIS — M1711 Unilateral primary osteoarthritis, right knee: Secondary | ICD-10-CM | POA: Diagnosis not present

## 2022-11-12 DIAGNOSIS — R262 Difficulty in walking, not elsewhere classified: Secondary | ICD-10-CM | POA: Diagnosis not present

## 2022-11-12 DIAGNOSIS — M6281 Muscle weakness (generalized): Secondary | ICD-10-CM | POA: Diagnosis not present

## 2022-11-12 DIAGNOSIS — M1711 Unilateral primary osteoarthritis, right knee: Secondary | ICD-10-CM | POA: Diagnosis not present

## 2022-11-12 DIAGNOSIS — M25661 Stiffness of right knee, not elsewhere classified: Secondary | ICD-10-CM | POA: Diagnosis not present

## 2022-11-13 DIAGNOSIS — M1711 Unilateral primary osteoarthritis, right knee: Secondary | ICD-10-CM | POA: Diagnosis not present

## 2022-11-14 DIAGNOSIS — R262 Difficulty in walking, not elsewhere classified: Secondary | ICD-10-CM | POA: Diagnosis not present

## 2022-11-14 DIAGNOSIS — M25661 Stiffness of right knee, not elsewhere classified: Secondary | ICD-10-CM | POA: Diagnosis not present

## 2022-11-14 DIAGNOSIS — M1711 Unilateral primary osteoarthritis, right knee: Secondary | ICD-10-CM | POA: Diagnosis not present

## 2022-11-14 DIAGNOSIS — M6281 Muscle weakness (generalized): Secondary | ICD-10-CM | POA: Diagnosis not present

## 2022-11-21 DIAGNOSIS — M1711 Unilateral primary osteoarthritis, right knee: Secondary | ICD-10-CM | POA: Diagnosis not present

## 2022-11-21 DIAGNOSIS — R262 Difficulty in walking, not elsewhere classified: Secondary | ICD-10-CM | POA: Diagnosis not present

## 2022-11-21 DIAGNOSIS — M25661 Stiffness of right knee, not elsewhere classified: Secondary | ICD-10-CM | POA: Diagnosis not present

## 2022-11-21 DIAGNOSIS — M6281 Muscle weakness (generalized): Secondary | ICD-10-CM | POA: Diagnosis not present

## 2022-11-28 DIAGNOSIS — Z01419 Encounter for gynecological examination (general) (routine) without abnormal findings: Secondary | ICD-10-CM | POA: Diagnosis not present

## 2022-11-28 DIAGNOSIS — M25661 Stiffness of right knee, not elsewhere classified: Secondary | ICD-10-CM | POA: Diagnosis not present

## 2022-11-28 DIAGNOSIS — R262 Difficulty in walking, not elsewhere classified: Secondary | ICD-10-CM | POA: Diagnosis not present

## 2022-11-28 DIAGNOSIS — N952 Postmenopausal atrophic vaginitis: Secondary | ICD-10-CM | POA: Diagnosis not present

## 2022-11-28 DIAGNOSIS — M6281 Muscle weakness (generalized): Secondary | ICD-10-CM | POA: Diagnosis not present

## 2022-11-28 DIAGNOSIS — Z6827 Body mass index (BMI) 27.0-27.9, adult: Secondary | ICD-10-CM | POA: Diagnosis not present

## 2022-11-28 DIAGNOSIS — N959 Unspecified menopausal and perimenopausal disorder: Secondary | ICD-10-CM | POA: Diagnosis not present

## 2022-11-28 DIAGNOSIS — Z1272 Encounter for screening for malignant neoplasm of vagina: Secondary | ICD-10-CM | POA: Diagnosis not present

## 2022-11-28 DIAGNOSIS — M1711 Unilateral primary osteoarthritis, right knee: Secondary | ICD-10-CM | POA: Diagnosis not present

## 2022-12-11 DIAGNOSIS — M1711 Unilateral primary osteoarthritis, right knee: Secondary | ICD-10-CM | POA: Diagnosis not present

## 2022-12-18 DIAGNOSIS — Z5189 Encounter for other specified aftercare: Secondary | ICD-10-CM | POA: Diagnosis not present

## 2022-12-18 DIAGNOSIS — Z85828 Personal history of other malignant neoplasm of skin: Secondary | ICD-10-CM | POA: Diagnosis not present

## 2022-12-18 DIAGNOSIS — L570 Actinic keratosis: Secondary | ICD-10-CM | POA: Diagnosis not present

## 2022-12-21 DIAGNOSIS — M1711 Unilateral primary osteoarthritis, right knee: Secondary | ICD-10-CM | POA: Diagnosis not present

## 2023-01-10 DIAGNOSIS — E559 Vitamin D deficiency, unspecified: Secondary | ICD-10-CM | POA: Diagnosis not present

## 2023-01-10 DIAGNOSIS — E782 Mixed hyperlipidemia: Secondary | ICD-10-CM | POA: Diagnosis not present

## 2023-01-14 DIAGNOSIS — E559 Vitamin D deficiency, unspecified: Secondary | ICD-10-CM | POA: Diagnosis not present

## 2023-01-14 DIAGNOSIS — E782 Mixed hyperlipidemia: Secondary | ICD-10-CM | POA: Diagnosis not present

## 2023-01-14 DIAGNOSIS — G720 Drug-induced myopathy: Secondary | ICD-10-CM | POA: Diagnosis not present

## 2023-01-14 DIAGNOSIS — Z789 Other specified health status: Secondary | ICD-10-CM | POA: Diagnosis not present

## 2023-01-28 DIAGNOSIS — K080 Exfoliation of teeth due to systemic causes: Secondary | ICD-10-CM | POA: Diagnosis not present

## 2023-02-06 DIAGNOSIS — N3941 Urge incontinence: Secondary | ICD-10-CM | POA: Diagnosis not present

## 2023-02-06 DIAGNOSIS — N952 Postmenopausal atrophic vaginitis: Secondary | ICD-10-CM | POA: Diagnosis not present

## 2023-02-06 DIAGNOSIS — N3281 Overactive bladder: Secondary | ICD-10-CM | POA: Diagnosis not present

## 2023-02-06 DIAGNOSIS — N3642 Intrinsic sphincter deficiency (ISD): Secondary | ICD-10-CM | POA: Diagnosis not present

## 2023-03-05 DIAGNOSIS — R194 Change in bowel habit: Secondary | ICD-10-CM | POA: Diagnosis not present

## 2023-03-05 DIAGNOSIS — E782 Mixed hyperlipidemia: Secondary | ICD-10-CM | POA: Diagnosis not present

## 2023-03-05 DIAGNOSIS — K648 Other hemorrhoids: Secondary | ICD-10-CM | POA: Diagnosis not present

## 2023-03-05 DIAGNOSIS — K625 Hemorrhage of anus and rectum: Secondary | ICD-10-CM | POA: Diagnosis not present

## 2023-03-05 DIAGNOSIS — G43909 Migraine, unspecified, not intractable, without status migrainosus: Secondary | ICD-10-CM | POA: Diagnosis not present

## 2023-03-13 DIAGNOSIS — K625 Hemorrhage of anus and rectum: Secondary | ICD-10-CM | POA: Diagnosis not present

## 2023-03-13 DIAGNOSIS — K640 First degree hemorrhoids: Secondary | ICD-10-CM | POA: Diagnosis not present

## 2023-03-13 DIAGNOSIS — R195 Other fecal abnormalities: Secondary | ICD-10-CM | POA: Diagnosis not present

## 2023-03-28 DIAGNOSIS — K5730 Diverticulosis of large intestine without perforation or abscess without bleeding: Secondary | ICD-10-CM | POA: Diagnosis not present

## 2023-03-28 DIAGNOSIS — K921 Melena: Secondary | ICD-10-CM | POA: Diagnosis not present

## 2023-03-28 DIAGNOSIS — K5731 Diverticulosis of large intestine without perforation or abscess with bleeding: Secondary | ICD-10-CM | POA: Diagnosis not present

## 2023-04-16 DIAGNOSIS — J449 Chronic obstructive pulmonary disease, unspecified: Secondary | ICD-10-CM | POA: Diagnosis not present

## 2023-04-16 DIAGNOSIS — K648 Other hemorrhoids: Secondary | ICD-10-CM | POA: Diagnosis not present

## 2023-04-16 DIAGNOSIS — I10 Essential (primary) hypertension: Secondary | ICD-10-CM | POA: Diagnosis not present

## 2023-04-19 ENCOUNTER — Other Ambulatory Visit: Payer: Self-pay | Admitting: Family Medicine

## 2023-04-19 DIAGNOSIS — Z1231 Encounter for screening mammogram for malignant neoplasm of breast: Secondary | ICD-10-CM

## 2023-05-23 DIAGNOSIS — M25511 Pain in right shoulder: Secondary | ICD-10-CM | POA: Diagnosis not present

## 2023-06-03 ENCOUNTER — Ambulatory Visit
Admission: RE | Admit: 2023-06-03 | Discharge: 2023-06-03 | Disposition: A | Discharge status: Home / Self Care | Payer: Medicare Other | Source: Ambulatory Visit | Attending: Family Medicine | Admitting: Family Medicine

## 2023-06-03 DIAGNOSIS — Z1231 Encounter for screening mammogram for malignant neoplasm of breast: Secondary | ICD-10-CM

## 2023-06-11 DIAGNOSIS — M1990 Unspecified osteoarthritis, unspecified site: Secondary | ICD-10-CM | POA: Diagnosis not present

## 2023-06-11 DIAGNOSIS — Z818 Family history of other mental and behavioral disorders: Secondary | ICD-10-CM | POA: Diagnosis not present

## 2023-06-11 DIAGNOSIS — K219 Gastro-esophageal reflux disease without esophagitis: Secondary | ICD-10-CM | POA: Diagnosis not present

## 2023-06-11 DIAGNOSIS — E039 Hypothyroidism, unspecified: Secondary | ICD-10-CM | POA: Diagnosis not present

## 2023-06-11 DIAGNOSIS — Z720 Tobacco use: Secondary | ICD-10-CM | POA: Diagnosis not present

## 2023-06-11 DIAGNOSIS — I10 Essential (primary) hypertension: Secondary | ICD-10-CM | POA: Diagnosis not present

## 2023-06-11 DIAGNOSIS — Z882 Allergy status to sulfonamides status: Secondary | ICD-10-CM | POA: Diagnosis not present

## 2023-06-11 DIAGNOSIS — E785 Hyperlipidemia, unspecified: Secondary | ICD-10-CM | POA: Diagnosis not present

## 2023-07-08 DIAGNOSIS — Z0000 Encounter for general adult medical examination without abnormal findings: Secondary | ICD-10-CM | POA: Diagnosis not present

## 2023-07-08 DIAGNOSIS — M2550 Pain in unspecified joint: Secondary | ICD-10-CM | POA: Diagnosis not present

## 2023-07-08 DIAGNOSIS — Z1339 Encounter for screening examination for other mental health and behavioral disorders: Secondary | ICD-10-CM | POA: Diagnosis not present

## 2023-07-08 DIAGNOSIS — G8929 Other chronic pain: Secondary | ICD-10-CM | POA: Diagnosis not present

## 2023-07-08 DIAGNOSIS — Z1331 Encounter for screening for depression: Secondary | ICD-10-CM | POA: Diagnosis not present

## 2023-07-08 DIAGNOSIS — J4540 Moderate persistent asthma, uncomplicated: Secondary | ICD-10-CM | POA: Diagnosis not present

## 2023-07-09 ENCOUNTER — Other Ambulatory Visit: Payer: Self-pay | Admitting: Family Medicine

## 2023-07-09 DIAGNOSIS — I10 Essential (primary) hypertension: Secondary | ICD-10-CM | POA: Diagnosis not present

## 2023-07-09 DIAGNOSIS — Z87891 Personal history of nicotine dependence: Secondary | ICD-10-CM

## 2023-07-09 DIAGNOSIS — M1990 Unspecified osteoarthritis, unspecified site: Secondary | ICD-10-CM | POA: Diagnosis not present

## 2023-07-09 DIAGNOSIS — E039 Hypothyroidism, unspecified: Secondary | ICD-10-CM | POA: Diagnosis not present

## 2023-07-09 DIAGNOSIS — E785 Hyperlipidemia, unspecified: Secondary | ICD-10-CM

## 2023-07-15 ENCOUNTER — Other Ambulatory Visit: Payer: Self-pay | Admitting: Orthopaedic Surgery

## 2023-07-15 DIAGNOSIS — T8484XA Pain due to internal orthopedic prosthetic devices, implants and grafts, initial encounter: Secondary | ICD-10-CM

## 2023-07-17 DIAGNOSIS — G720 Drug-induced myopathy: Secondary | ICD-10-CM | POA: Diagnosis not present

## 2023-07-17 DIAGNOSIS — E782 Mixed hyperlipidemia: Secondary | ICD-10-CM | POA: Diagnosis not present

## 2023-07-17 DIAGNOSIS — M2550 Pain in unspecified joint: Secondary | ICD-10-CM | POA: Diagnosis not present

## 2023-07-17 DIAGNOSIS — E039 Hypothyroidism, unspecified: Secondary | ICD-10-CM | POA: Diagnosis not present

## 2023-07-17 DIAGNOSIS — Z789 Other specified health status: Secondary | ICD-10-CM | POA: Diagnosis not present

## 2023-07-29 ENCOUNTER — Ambulatory Visit
Admission: RE | Admit: 2023-07-29 | Discharge: 2023-07-29 | Disposition: A | Discharge status: Home / Self Care | Payer: Medicare Other | Source: Ambulatory Visit | Attending: Family Medicine | Admitting: Family Medicine

## 2023-07-29 ENCOUNTER — Ambulatory Visit
Admission: RE | Admit: 2023-07-29 | Discharge: 2023-07-29 | Disposition: A | Discharge status: Home / Self Care | Payer: Medicare Other | Source: Ambulatory Visit | Attending: Orthopaedic Surgery | Admitting: Orthopaedic Surgery

## 2023-07-29 DIAGNOSIS — E785 Hyperlipidemia, unspecified: Secondary | ICD-10-CM

## 2023-07-29 DIAGNOSIS — Z96611 Presence of right artificial shoulder joint: Secondary | ICD-10-CM | POA: Diagnosis not present

## 2023-07-29 DIAGNOSIS — T8484XA Pain due to internal orthopedic prosthetic devices, implants and grafts, initial encounter: Secondary | ICD-10-CM

## 2023-07-29 DIAGNOSIS — M19011 Primary osteoarthritis, right shoulder: Secondary | ICD-10-CM | POA: Diagnosis not present

## 2023-07-29 DIAGNOSIS — Z87891 Personal history of nicotine dependence: Secondary | ICD-10-CM

## 2023-07-29 DIAGNOSIS — M25511 Pain in right shoulder: Secondary | ICD-10-CM | POA: Diagnosis not present

## 2023-07-31 DIAGNOSIS — M79641 Pain in right hand: Secondary | ICD-10-CM | POA: Diagnosis not present

## 2023-07-31 DIAGNOSIS — M79642 Pain in left hand: Secondary | ICD-10-CM | POA: Diagnosis not present

## 2023-07-31 DIAGNOSIS — M2559 Pain in other specified joint: Secondary | ICD-10-CM | POA: Diagnosis not present

## 2023-07-31 DIAGNOSIS — M2560 Stiffness of unspecified joint, not elsewhere classified: Secondary | ICD-10-CM | POA: Diagnosis not present

## 2023-07-31 DIAGNOSIS — M2540 Effusion, unspecified joint: Secondary | ICD-10-CM | POA: Diagnosis not present

## 2023-07-31 DIAGNOSIS — R21 Rash and other nonspecific skin eruption: Secondary | ICD-10-CM | POA: Diagnosis not present

## 2023-08-06 DIAGNOSIS — K080 Exfoliation of teeth due to systemic causes: Secondary | ICD-10-CM | POA: Diagnosis not present

## 2023-08-13 DIAGNOSIS — L578 Other skin changes due to chronic exposure to nonionizing radiation: Secondary | ICD-10-CM | POA: Diagnosis not present

## 2023-08-13 DIAGNOSIS — D225 Melanocytic nevi of trunk: Secondary | ICD-10-CM | POA: Diagnosis not present

## 2023-08-13 DIAGNOSIS — L814 Other melanin hyperpigmentation: Secondary | ICD-10-CM | POA: Diagnosis not present

## 2023-08-13 DIAGNOSIS — L821 Other seborrheic keratosis: Secondary | ICD-10-CM | POA: Diagnosis not present

## 2023-08-13 DIAGNOSIS — L570 Actinic keratosis: Secondary | ICD-10-CM | POA: Diagnosis not present

## 2023-08-14 DIAGNOSIS — E782 Mixed hyperlipidemia: Secondary | ICD-10-CM | POA: Diagnosis not present

## 2023-08-14 DIAGNOSIS — Z23 Encounter for immunization: Secondary | ICD-10-CM | POA: Diagnosis not present

## 2023-08-14 DIAGNOSIS — M2540 Effusion, unspecified joint: Secondary | ICD-10-CM | POA: Diagnosis not present

## 2023-08-14 DIAGNOSIS — Z789 Other specified health status: Secondary | ICD-10-CM | POA: Diagnosis not present

## 2023-08-14 DIAGNOSIS — M2559 Pain in other specified joint: Secondary | ICD-10-CM | POA: Diagnosis not present

## 2023-08-14 DIAGNOSIS — M79671 Pain in right foot: Secondary | ICD-10-CM | POA: Diagnosis not present

## 2023-08-14 DIAGNOSIS — R21 Rash and other nonspecific skin eruption: Secondary | ICD-10-CM | POA: Diagnosis not present

## 2023-08-14 DIAGNOSIS — G720 Drug-induced myopathy: Secondary | ICD-10-CM | POA: Diagnosis not present

## 2023-08-14 DIAGNOSIS — J449 Chronic obstructive pulmonary disease, unspecified: Secondary | ICD-10-CM | POA: Diagnosis not present

## 2023-08-15 DIAGNOSIS — M25511 Pain in right shoulder: Secondary | ICD-10-CM | POA: Diagnosis not present

## 2023-08-21 DIAGNOSIS — M25511 Pain in right shoulder: Secondary | ICD-10-CM | POA: Diagnosis not present

## 2023-08-22 DIAGNOSIS — R262 Difficulty in walking, not elsewhere classified: Secondary | ICD-10-CM | POA: Diagnosis not present

## 2023-08-22 DIAGNOSIS — S92034A Nondisplaced avulsion fracture of tuberosity of right calcaneus, initial encounter for closed fracture: Secondary | ICD-10-CM | POA: Diagnosis not present

## 2023-08-22 DIAGNOSIS — M792 Neuralgia and neuritis, unspecified: Secondary | ICD-10-CM | POA: Diagnosis not present

## 2023-08-30 DIAGNOSIS — I10 Essential (primary) hypertension: Secondary | ICD-10-CM | POA: Diagnosis not present

## 2023-08-30 DIAGNOSIS — R5383 Other fatigue: Secondary | ICD-10-CM | POA: Diagnosis not present

## 2023-08-30 DIAGNOSIS — E785 Hyperlipidemia, unspecified: Secondary | ICD-10-CM | POA: Diagnosis not present

## 2023-08-30 DIAGNOSIS — E039 Hypothyroidism, unspecified: Secondary | ICD-10-CM | POA: Diagnosis not present

## 2023-09-02 DIAGNOSIS — G720 Drug-induced myopathy: Secondary | ICD-10-CM | POA: Diagnosis not present

## 2023-09-02 DIAGNOSIS — E039 Hypothyroidism, unspecified: Secondary | ICD-10-CM | POA: Diagnosis not present

## 2023-09-02 DIAGNOSIS — E782 Mixed hyperlipidemia: Secondary | ICD-10-CM | POA: Diagnosis not present

## 2023-09-02 DIAGNOSIS — Z789 Other specified health status: Secondary | ICD-10-CM | POA: Diagnosis not present

## 2023-09-03 DIAGNOSIS — M25511 Pain in right shoulder: Secondary | ICD-10-CM | POA: Diagnosis not present

## 2023-09-10 DIAGNOSIS — K080 Exfoliation of teeth due to systemic causes: Secondary | ICD-10-CM | POA: Diagnosis not present

## 2023-09-11 DIAGNOSIS — L0291 Cutaneous abscess, unspecified: Secondary | ICD-10-CM | POA: Diagnosis not present

## 2023-09-11 DIAGNOSIS — M170 Bilateral primary osteoarthritis of knee: Secondary | ICD-10-CM | POA: Diagnosis not present

## 2023-09-11 DIAGNOSIS — J449 Chronic obstructive pulmonary disease, unspecified: Secondary | ICD-10-CM | POA: Diagnosis not present

## 2023-09-17 DIAGNOSIS — S92034D Nondisplaced avulsion fracture of tuberosity of right calcaneus, subsequent encounter for fracture with routine healing: Secondary | ICD-10-CM | POA: Diagnosis not present

## 2023-09-17 DIAGNOSIS — M792 Neuralgia and neuritis, unspecified: Secondary | ICD-10-CM | POA: Diagnosis not present

## 2023-09-17 DIAGNOSIS — M722 Plantar fascial fibromatosis: Secondary | ICD-10-CM | POA: Diagnosis not present

## 2023-10-01 DIAGNOSIS — M722 Plantar fascial fibromatosis: Secondary | ICD-10-CM | POA: Diagnosis not present

## 2023-10-01 DIAGNOSIS — S92034D Nondisplaced avulsion fracture of tuberosity of right calcaneus, subsequent encounter for fracture with routine healing: Secondary | ICD-10-CM | POA: Diagnosis not present

## 2023-10-01 DIAGNOSIS — M792 Neuralgia and neuritis, unspecified: Secondary | ICD-10-CM | POA: Diagnosis not present

## 2023-10-14 DIAGNOSIS — R5383 Other fatigue: Secondary | ICD-10-CM | POA: Diagnosis not present

## 2023-10-14 DIAGNOSIS — D649 Anemia, unspecified: Secondary | ICD-10-CM | POA: Diagnosis not present

## 2023-10-14 DIAGNOSIS — E785 Hyperlipidemia, unspecified: Secondary | ICD-10-CM | POA: Diagnosis not present

## 2023-10-16 DIAGNOSIS — M792 Neuralgia and neuritis, unspecified: Secondary | ICD-10-CM | POA: Diagnosis not present

## 2023-10-17 DIAGNOSIS — E782 Mixed hyperlipidemia: Secondary | ICD-10-CM | POA: Diagnosis not present

## 2023-10-17 DIAGNOSIS — E039 Hypothyroidism, unspecified: Secondary | ICD-10-CM | POA: Diagnosis not present

## 2023-10-17 DIAGNOSIS — Z789 Other specified health status: Secondary | ICD-10-CM | POA: Diagnosis not present

## 2023-10-17 DIAGNOSIS — D509 Iron deficiency anemia, unspecified: Secondary | ICD-10-CM | POA: Diagnosis not present

## 2023-10-18 ENCOUNTER — Ambulatory Visit: Payer: Medicare Other

## 2023-10-18 ENCOUNTER — Ambulatory Visit: Payer: Medicare Other | Admitting: Cardiology

## 2023-10-28 DIAGNOSIS — M25511 Pain in right shoulder: Secondary | ICD-10-CM | POA: Diagnosis not present

## 2023-10-31 ENCOUNTER — Ambulatory Visit: Payer: Medicare Other | Admitting: Cardiology

## 2023-10-31 ENCOUNTER — Encounter: Payer: Self-pay | Admitting: Cardiology

## 2023-10-31 VITALS — BP 134/72 | HR 71 | Resp 16 | Ht 62.0 in | Wt 157.8 lb

## 2023-10-31 DIAGNOSIS — I10 Essential (primary) hypertension: Secondary | ICD-10-CM | POA: Diagnosis not present

## 2023-10-31 DIAGNOSIS — R002 Palpitations: Secondary | ICD-10-CM | POA: Diagnosis not present

## 2023-10-31 DIAGNOSIS — I351 Nonrheumatic aortic (valve) insufficiency: Secondary | ICD-10-CM | POA: Diagnosis not present

## 2023-10-31 MED ORDER — PRAVASTATIN SODIUM 20 MG PO TABS: 20.0000 mg | ORAL_TABLET | Freq: Every evening | ORAL | 3 refills | Status: AC

## 2023-10-31 NOTE — Progress Notes (Signed)
Cardiology Office Note:  .   Date:  10/31/2023  ID:  Stephanie Brennan, DOB 26-Feb-1946, MRN [id number] PCP: Lewis Moccasin, MD  [facility] Providers Cardiologist:  Truett Mainland, MD PCP: Lewis Moccasin, MD  Chief Complaint  Patient presents with   Palpitations   Follow-up    1 year      History of Present Illness: .    Stephanie Brennan is a 77 y.o. female with hypertension, aortic regurgitation  Patient stays physically active, with guarding and regular walking.  She denies any chest pain, shortness of breath symptoms.  Blood pressure has remained elevated, particularly with SBP >130 mmHg.  She does not drink high amounts of caffeine or alcohol.  She does use ibuprofen regularly which is almost essential to help with her stiffness symptoms with her osteoporosis.  Without ibuprofen, she has had deliberate rating stiffness, as well as migraine symptoms.  Reviewed recent CT cardiac scoring results with the patient, details below.  Vitals:   10/31/23 1513  BP: 134/72  Pulse: 71  Resp: 16  SpO2: 93%     ROS:  Review of Systems  Cardiovascular:  Negative for chest pain, dyspnea on exertion, leg swelling, palpitations and syncope.     Studies Reviewed: Marland Kitchen        EKG 10/31/2023: Normal sinus rhythm Low voltage QRS Septal infarct , age undetermined No previous ECGs available  Labs 10/14/2023: Chol 233, TG 97, HDL 91, LDL 122   CT cardiac scoring 07/29/2023: 1. Total Agatston score of 6.9 (LAD & Lcx), corresponding to 25th percentile for age, sex, and race based cohort. 2. Left-greater-than-right lower lobe peribronchovascular nodularity, bronchial wall thickening, mucoid impaction, and mild consolidation. 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 Superimposed acute/subacute infection, including in the left lower lobe, cannot be excluded. 3.  Aortic  Atherosclerosis  Echocardiogram 07/27/2022: Left ventricle cavity is normal in size. Mild concentric hypertrophy of the left ventricle. Normal global wall motion. LV systolic function with EF 65-70%. Doppler evidence of grade I (impaired) diastolic dysfunction, normal LAP. Structurally normal trileaflet aortic valve.  Mild to moderate aortic regurgitation. Trace MR, TR. No evidence of pulmonary hypertension. No significant change compared to previous study on 07/06/2021.   Physical Exam:   Physical Exam Vitals and nursing note reviewed.  Constitutional:      General: She is not in acute distress. Neck:     Vascular: No JVD.  Cardiovascular:     Rate and Rhythm: Normal rate and regular rhythm.     Heart sounds: Murmur heard.     High-pitched blowing decrescendo early diastolic murmur is present with a grade of 2/4 at the upper right sternal border radiating to the apex.  Pulmonary:     Effort: Pulmonary effort is normal.     Breath sounds: Normal breath sounds. No wheezing or rales.  Musculoskeletal:     Right lower leg: No edema.     Left lower leg: No edema.      VISIT DIAGNOSES:   ICD-10-CM   1. Palpitations  R00.2 EKG 12-Lead    2. Primary hypertension  I10 AMB Referral to [facility] Pharm-D    3. Nonrheumatic aortic valve insufficiency  I35.1 ECHOCARDIOGRAM COMPLETE       ASSESSMENT AND PLAN: .    Stephanie Brennan is a 77 y.o. female with hypertension, aortic regurgitation  Hypertension: Only slightly suboptimal with SBP 130-140 mmHg. She is currently on amlodipine 10 mg  daily, metoprolol 12.5 mg twice daily. Amlodipine was recently increased from 2.5-5, and then to 10 mg daily. With past one week.  Metoprolol has helped with her palpitation symptoms in the past. We discussed various options and mutually agreed upon the following option: No change in the medications today.  Pharm.D. follow-up in 1 month with regular home blood pressure check monitors.  Have encouraged  her to bring her blood pressure log, and her home monitor at the Pharm.D. visit.  If SBP consistently >140 mmHg, then recommend adding losartan 25 mg daily.  Aortic regurgitation: Mild to moderate.  Will check echocardiogram now.  If unchanged compared to previous echocardiogram, will repeated in 3 years.  Mixed hyperlipidemia: Minimally elevated calcium in coronary arteries up 25th percentile. She did not tolerate atorvastatin and Zetia due to myalgias. She is willing to try low-dose pravastatin 10 mg daily. Repeat lipid panel in 3 months.     Meds ordered this encounter  Medications   pravastatin (PRAVACHOL) 20 MG tablet    Sig: Take 1 tablet (20 mg total) by mouth every evening.    Dispense:  90 tablet    Refill:  3     F/u in 6 months  Signed, Elder Negus, MD

## 2023-10-31 NOTE — Patient Instructions (Signed)
Medication Instructions:   START TAKING PRAVASTATIN 20 MG BY MOUTH DAILY  *If you need a refill on your cardiac medications before your next appointment, please call your pharmacy*   You have been referred to OUR BLOOD PRESSURE CLINIC TO SEE THE PHARMACIST IN ONE MONTH    Testing/Procedures:  Your physician has requested that you have an echocardiogram. Echocardiography is a painless test that uses sound waves to create images of your heart. It provides your doctor with information about the size and shape of your heart and how well your heart's chambers and valves are working. This procedure takes approximately one hour. There are no restrictions for this procedure. Please do NOT wear cologne, perfume, aftershave, or lotions (deodorant is allowed). Please arrive 15 minutes prior to your appointment time.  Please note: We ask at that you not bring children with you during ultrasound (echo/ vascular) testing. Due to room size and safety concerns, children are not allowed in the ultrasound rooms during exams. Our front office staff cannot provide observation of children in our lobby area while testing is being conducted. An adult accompanying a patient to their appointment will only be allowed in the ultrasound room at the discretion of the ultrasound technician under special circumstances. We apologize for any inconvenience.    Follow-Up: At [facility], you and your health needs are our priority.  As part of our continuing mission to provide you with exceptional heart care, we have created designated Provider Care Teams.  These Care Teams include your primary Cardiologist (physician) and Advanced Practice Providers (APPs -  Physician Assistants and Nurse Practitioners) who all work together to provide you with the care you need, when you need it.  We recommend signing up for the patient portal called "MyChart".  Sign up information is provided on this After Visit Summary.  MyChart  is used to connect with patients for Virtual Visits (Telemedicine).  Patients are able to view lab/test results, encounter notes, upcoming appointments, etc.  Non-urgent messages can be sent to your provider as well.   To learn more about what you can do with MyChart, go to [email or web address].    Your next appointment:   6 month(s)  Provider:   DR. Rosemary Holms

## 2023-11-05 ENCOUNTER — Encounter (INDEPENDENT_AMBULATORY_CARE_PROVIDER_SITE_OTHER): Payer: Self-pay

## 2023-11-06 ENCOUNTER — Other Ambulatory Visit: Payer: Self-pay | Admitting: Medical Genetics

## 2023-11-13 ENCOUNTER — Other Ambulatory Visit: Payer: Self-pay | Admitting: Medical Genetics

## 2023-12-02 DIAGNOSIS — Z01419 Encounter for gynecological examination (general) (routine) without abnormal findings: Secondary | ICD-10-CM | POA: Diagnosis not present

## 2023-12-06 ENCOUNTER — Ambulatory Visit: Payer: Medicare Other | Admitting: Cardiology

## 2023-12-06 ENCOUNTER — Ambulatory Visit: Payer: Medicare Other | Attending: Cardiology

## 2023-12-06 VITALS — BP 132/68 | HR 62

## 2023-12-06 DIAGNOSIS — I10 Essential (primary) hypertension: Secondary | ICD-10-CM | POA: Insufficient documentation

## 2023-12-06 DIAGNOSIS — I351 Nonrheumatic aortic (valve) insufficiency: Secondary | ICD-10-CM | POA: Diagnosis not present

## 2023-12-06 NOTE — Assessment & Plan Note (Signed)
Assessment: Blood pressure well-controlled at home Blood pressure very close to goal today clinic Denies any side effects or symptoms of hyper- or hypotension She does have a history of lip numbness to ACE inhibitor therefore in the future would attempt to avoid ARB if possible  Plan: Continue amlodipine 10 mg daily and metoprolol 12.5 mg twice a day Follow-up as needed

## 2023-12-06 NOTE — Progress Notes (Signed)
Patient ID: [name]                 DOB: 01/07/1946                      MRN: [id number]      HPI: Stephanie Brennan is a 77 y.o. female referred by Dr. Rosemary Holms to HTN clinic. PMH is significant for HTN, palpitations, aortic regurgitation.  Seen by Dr. Rosemary Holms on 10/31/2023.  Blood pressure was slightly above goal.  Recently her amlodipine had been increased from 2.5-5 and then to 10 mg daily.  Therefore no changes were made and she was asked to follow-up in Pharm.D. clinic.  Patient presents today for follow-up.  Reports that she has had several readings at home in the 120s/ 60s.  Some in the 130s but several in the 120s. Was in the physician's office the other day and had a blood pressure 122/60 something.  She denies any dizziness or lightheadedness.  She has very minimal swelling reports some increased salt intake over the holidays.  Typically watches her sodium intake.  Active and goes for several walks throughout the day including a brisk walk in the morning.  Current HTN meds: amlodipine 10 mg daily, metoprolol 12.5 mg twice daily  Previously tried: ACE inhibitor (lip and face numbness) BP goal: Less than 130/80  Family History:  Family History  Problem Relation Age of Onset   Stroke Father    Kidney failure Daughter    Heart attack Paternal Uncle    Dementia Maternal Grandmother    Cancer Paternal Grandmother    Kidney failure Brother    Heart failure Brother    Breast cancer Neg Hx      Social History:1 glass of wine per night, no tobacco  Diet: watches salts Decaff coffee w/ half and half  Exercise:  Brisk walk in the AM- 1 mile Couple of walks throughout the day- walk gardening  Home BP readings: 122/60, did not bring in list of readings but reports several readings in the 120s over 60s to 70s  Wt Readings from Last 3 Encounters:  10/31/23 157 lb 12.8 oz (71.6 kg)  10/17/22 156 lb (70.8 kg)  03/05/22 159 lb (72.1 kg)   BP Readings from Last 3 Encounters:   12/06/23 132/68  10/31/23 134/72  10/17/22 (!) 144/79   Pulse Readings from Last 3 Encounters:  12/06/23 62  10/31/23 71  10/17/22 73    Renal function: CrCl cannot be calculated (No successful lab value found.).  Past Medical History:  Diagnosis Date   Asthma    History of bone density study 2015   [facility]   History of colonoscopy 2016   History of hysterectomy 1993   W.Ratchford, MD.   History of left hip replacement 2018   Osvaldo Shipper III, MD   History of mammogram 2021   [facility]   History of Papanicolaou smear of cervix    2021   History of right hip replacement 2013   Mal Amabile, MD   Hypothyroid    Migraine syndrome     Current Outpatient Medications on File Prior to Visit  Medication Sig Dispense Refill   amLODipine (NORVASC) 10 MG tablet Take 1 tablet (10 mg total) by mouth daily. 90 tablet 3   pravastatin (PRAVACHOL) 20 MG tablet Take 1 tablet (20 mg total) by mouth every evening. 90 tablet 3   Cholecalciferol (VITAMIN D3 PO) Take 1,000 Units  by mouth daily.     Coenzyme Q10 (COQ10) 100 MG CAPS Take 1 capsule by mouth daily.     erythromycin (ERY-TAB) 250 MG EC tablet Take 250 mg by mouth as needed. Dental Procedures. (Patient not taking: Reported on 10/31/2023)     estradiol (ESTRACE) 0.1 MG/GM vaginal cream Place 1 Applicatorful vaginally.     ibuprofen (ADVIL) 600 MG tablet Take 600 mg by mouth every 6 (six) hours as needed.     magnesium oxide (MAG-OX) 400 (240 Mg) MG tablet Take 400 mg by mouth daily.     metoprolol tartrate (LOPRESSOR) 25 MG tablet Take 0.5 tablets (12.5 mg total) by mouth 2 (two) times daily. 60 tablet 3   montelukast (SINGULAIR) 10 MG tablet Take 10 mg by mouth at bedtime.     Omega-3 1000 MG CAPS Take 1 capsule by mouth daily.     SPIRIVA HANDIHALER 18 MCG inhalation capsule 1 capsule daily.     SUMAtriptan (IMITREX) 100 MG tablet Take 100 mg by mouth as needed for migraine. May  repeat in 2 hours if headache persists or recurs.     Vitamins-Lipotropics (BALANCE B-100 PO) Take 1 capsule by mouth daily.     No current facility-administered medications on file prior to visit.    Allergies  Allergen Reactions   Ace Inhibitors     Lip Numbness, and Face Numbness.   Codeine     Dizziness, Imbalance.   Flagyl [Metronidazole]     Dizziness.   Penicillins Hives   Statins Other (See Comments)    Muscle cramps   Sulfa Antibiotics Hives   Tetracyclines & Related     Lip numbness.   Zetia [Ezetimibe] Other (See Comments)    Leg and foot cramps    Blood pressure 132/68, pulse 62.   Assessment/Plan:     1. Hypertension -  Primary hypertension Assessment: Blood pressure well-controlled at home Blood pressure very close to goal today clinic Denies any side effects or symptoms of hyper- or hypotension She does have a history of lip numbness to ACE inhibitor therefore in the future would attempt to avoid ARB if possible  Plan: Continue amlodipine 10 mg daily and metoprolol 12.5 mg twice a day Follow-up as needed   Thank you  Olene Floss, Pharm.D, BCACP, BCPS, CPP [facility] A Division of [facility] [address] [place], [place] [address]  Phone: [phone]; Fax: [phone]

## 2023-12-07 LAB — ECHOCARDIOGRAM COMPLETE
Area-P 1/2: 3.6 cm2
P 1/2 time: 433 ms
S' Lateral: 2.1 cm

## 2023-12-08 NOTE — Progress Notes (Signed)
Moderate leakiness of aortic valve, relatively stable. Repeat echocardiogram in 18 months.  Thanks MJP

## 2023-12-12 ENCOUNTER — Other Ambulatory Visit (HOSPITAL_COMMUNITY)
Admission: RE | Admit: 2023-12-12 | Discharge: 2023-12-12 | Disposition: A | Discharge status: Home / Self Care | Payer: Self-pay | Source: Ambulatory Visit | Attending: Oncology | Admitting: Oncology

## 2023-12-17 IMAGING — CR DG SHOULDER 2+V*L*
3 series · 3 of 3 positions shown · non-contrast
Comparison: None.

CLINICAL DATA: Bilateral shoulder pain, right greater than left
since [DATE]

EXAM:
RIGHT SHOULDER - 2+ VIEW
LEFT SHOULDER - 2+ VIEW

[w shoulder grashey left]
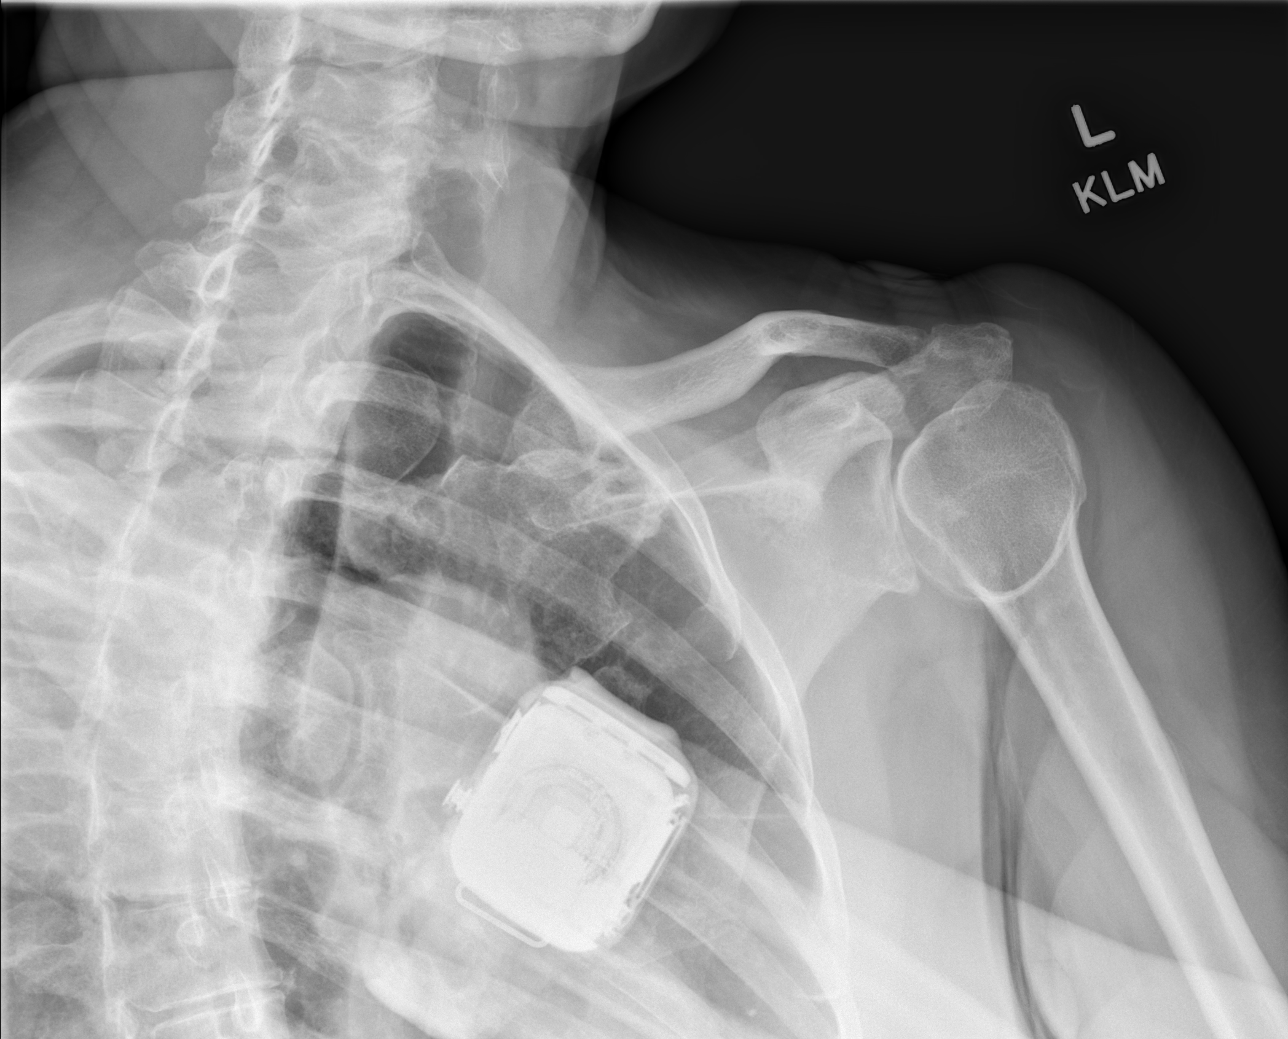

[w shoulder y-view left]
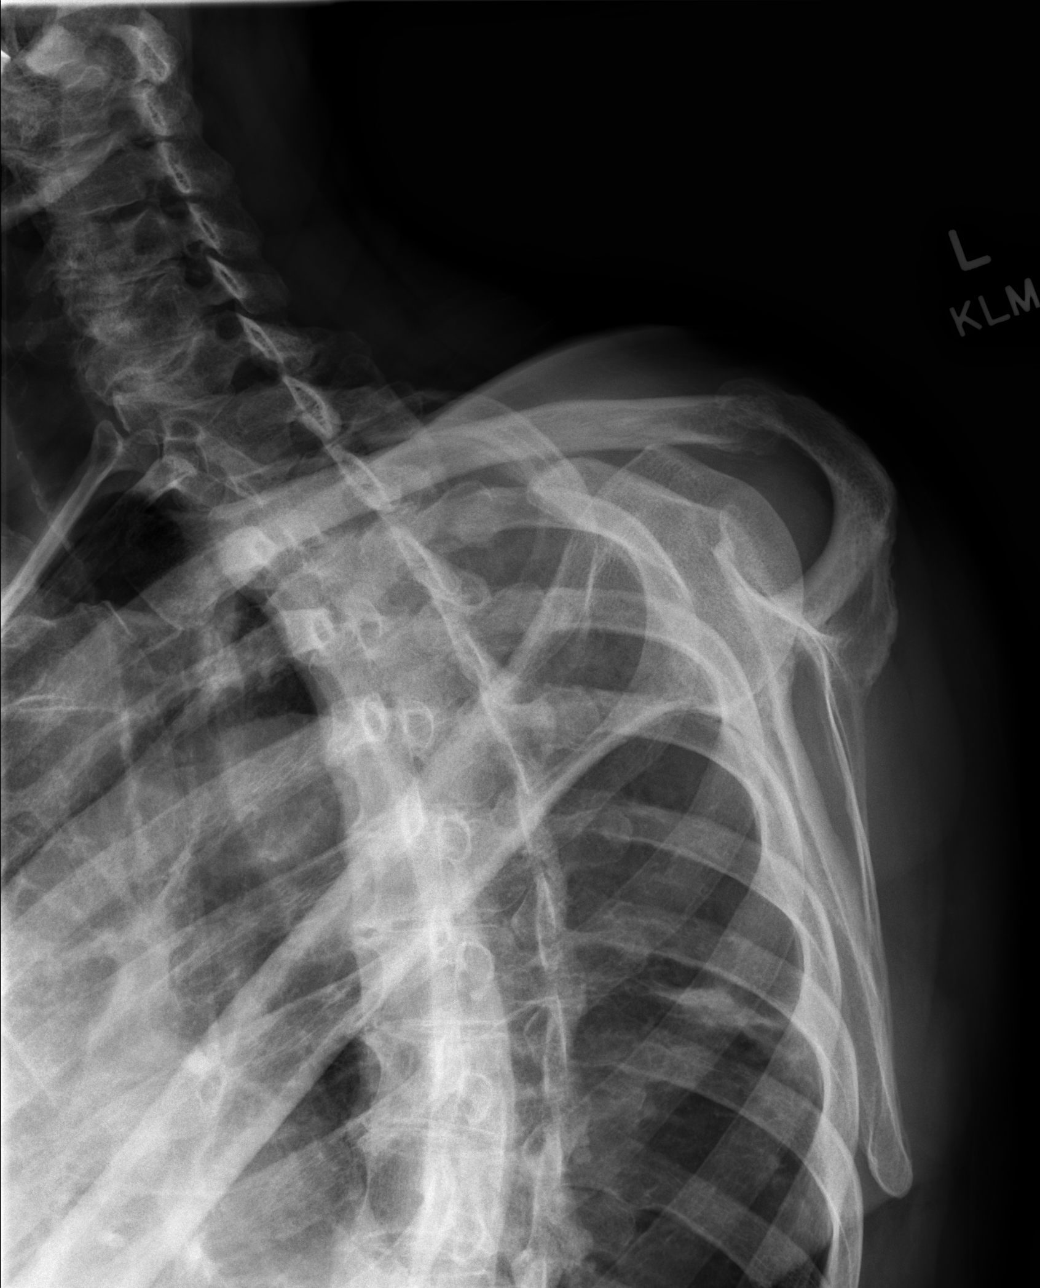

[x shoulder axillary left]
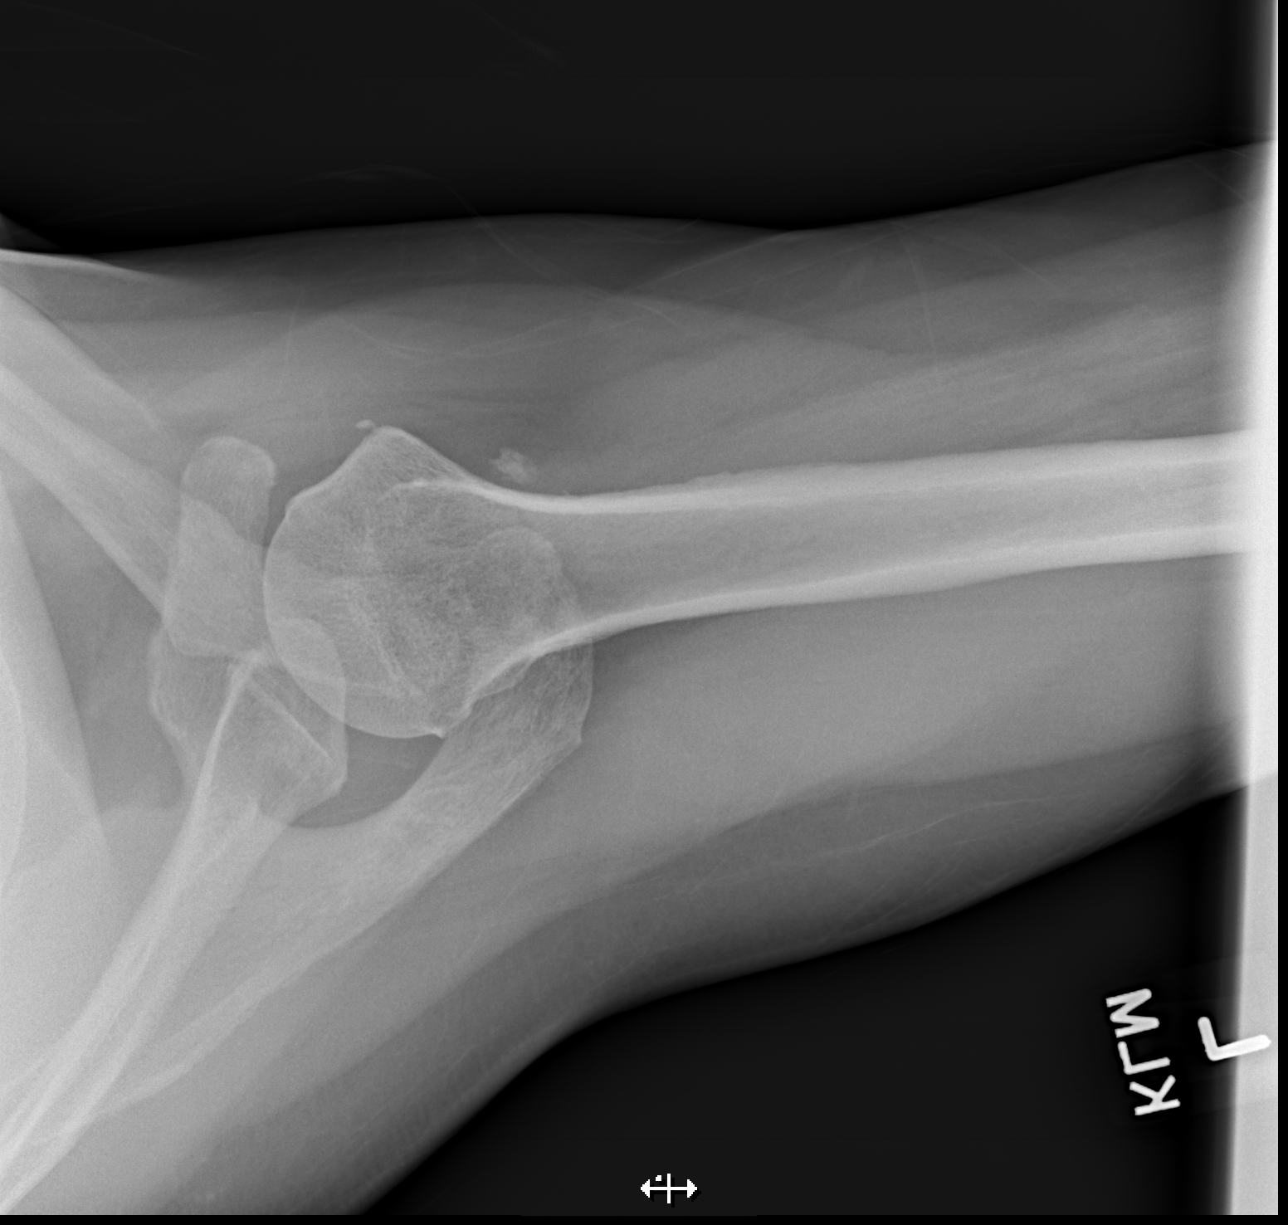

[3 of 3 positions shown; findings below may reference images not displayed]

FINDINGS: Right shoulder:

Bone mineralization is normal. There is no evidence of fracture or
dislocation. There is trace spurring of the AC joint. There is a
tiny calcification inferior to the lower lip of the glenoid process,
which could be a tiny chronic chip fracture or small loose body in
the axillary recess of the joint space.

Visualized portions of the right upper lung field are unremarkable.
Surrounding soft tissues are unremarkable.

Left shoulder:

There is normal bone mineralization without evidence of fractures or
dislocation. There is trace spurring at the AC joint, mild spurring
along the inferior glenohumeral joint with partial joint space loss
inferiorly.

On the axillary view there is a small calcification about the
greater tuberosity of the proximal humerus and a more ill-defined,
larger calcification approaching 9 mm adjacent the anterior aspect
of the proximal humeral metaphysis, not seen on the other views.

The visualized left upper lung field is unremarkable. Surrounding
soft tissues are unremarkable.
IMPRESSION: 1. Mild degenerative changes without evidence of fracture or
dislocation.
2. Tiny calcification inferior to the right lower glenoid lip, could
be a tiny chronic chip fracture or loose body in the joint space.
3. Dystrophic calcifications as described above on the left.

## 2023-12-17 IMAGING — CR DG SHOULDER 2+V*R*
3 series · 3 of 3 positions shown · non-contrast
Comparison: None.

CLINICAL DATA: Bilateral shoulder pain, right greater than left
since [DATE]

EXAM:
RIGHT SHOULDER - 2+ VIEW
LEFT SHOULDER - 2+ VIEW

[w shoulder grashey right]
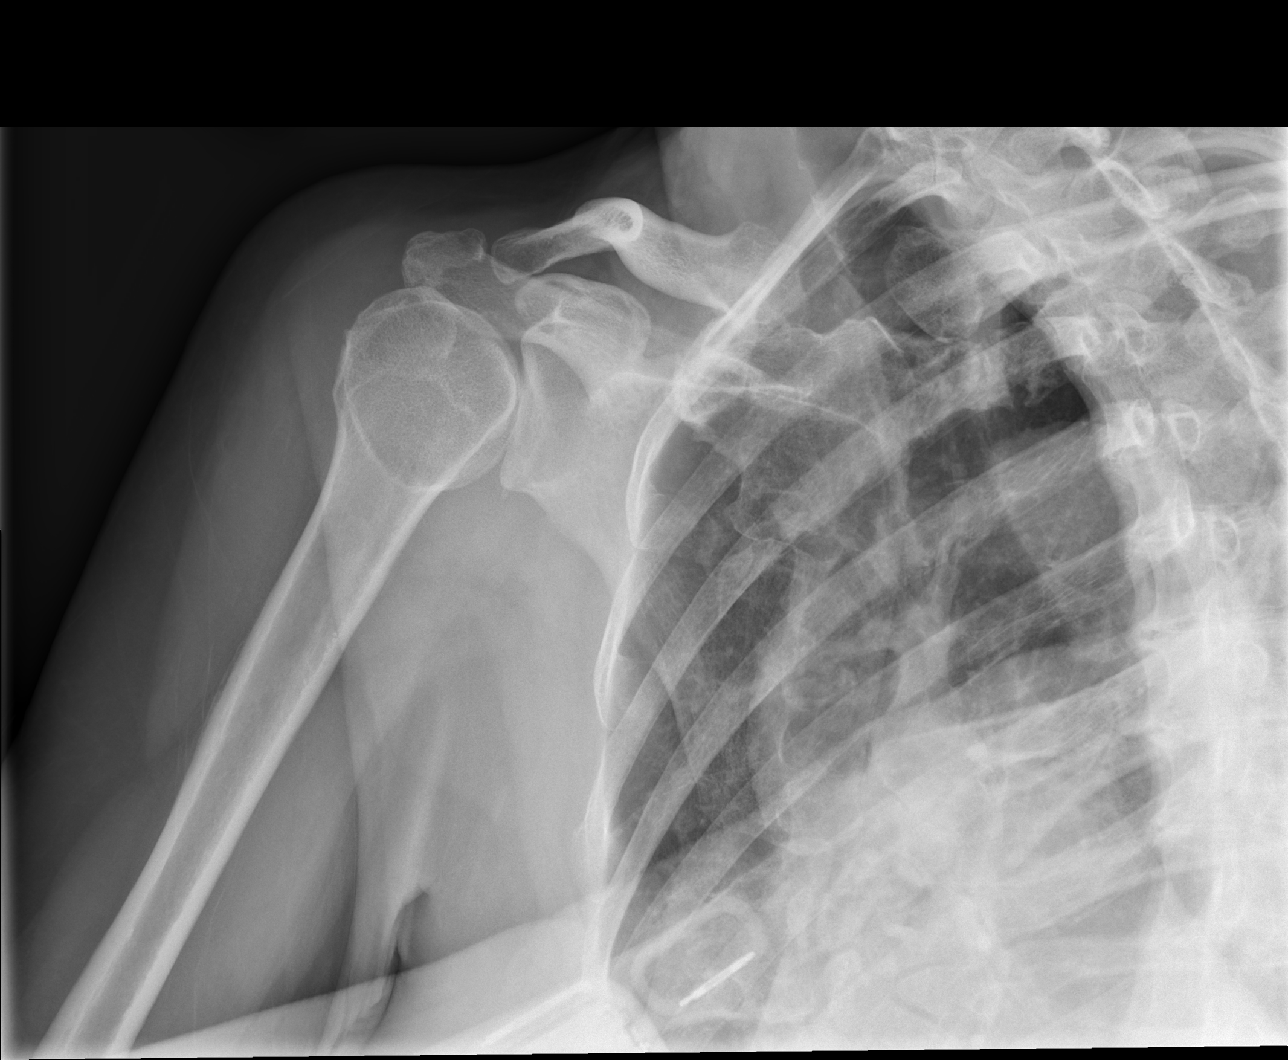

[w shoulder y-view right]
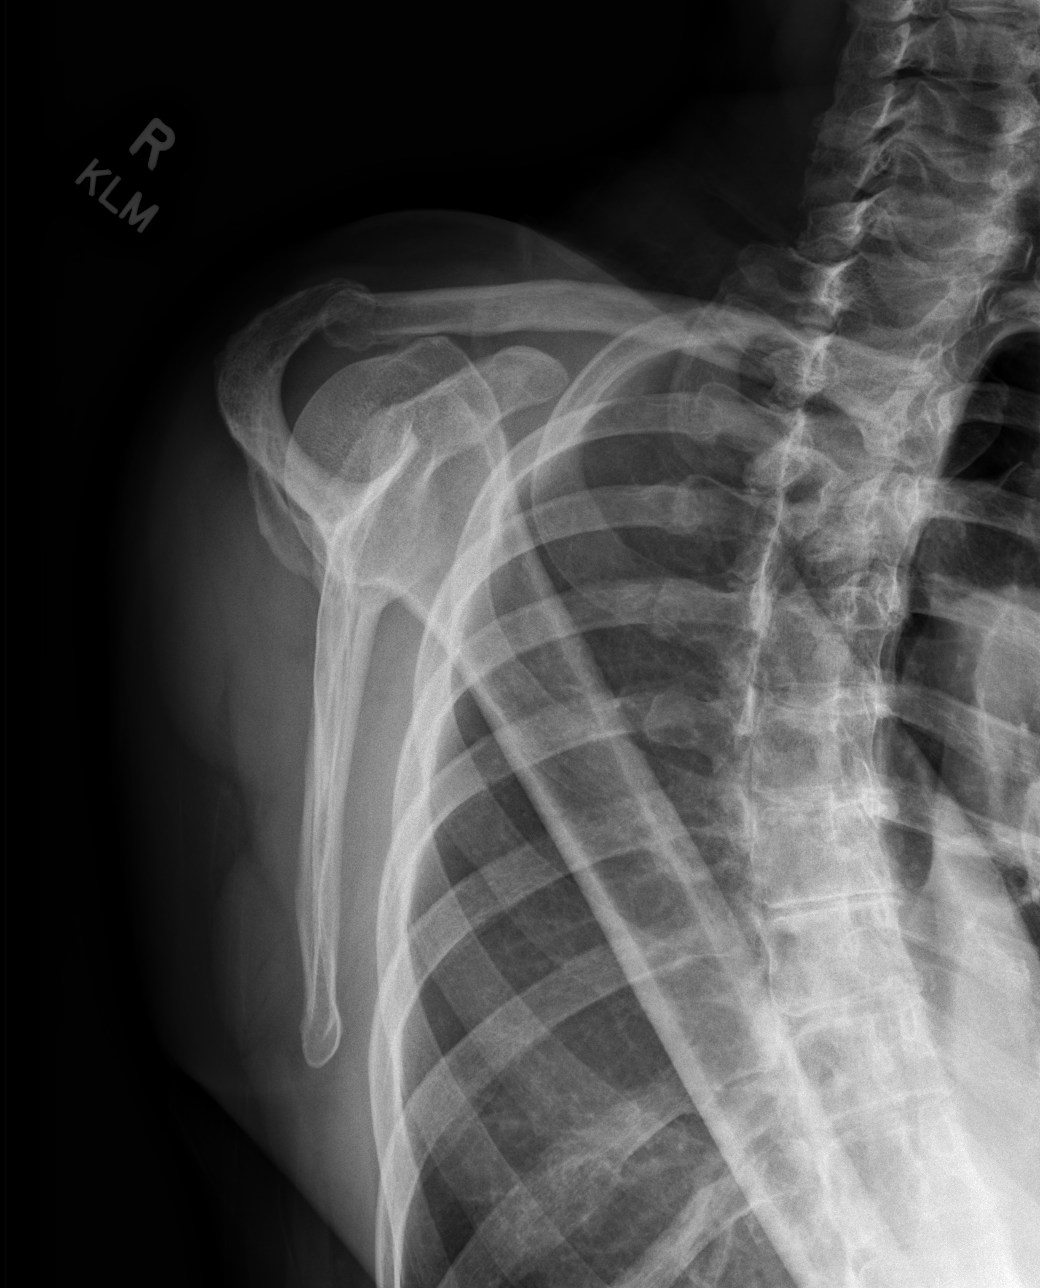

[x shoulder axillary right]
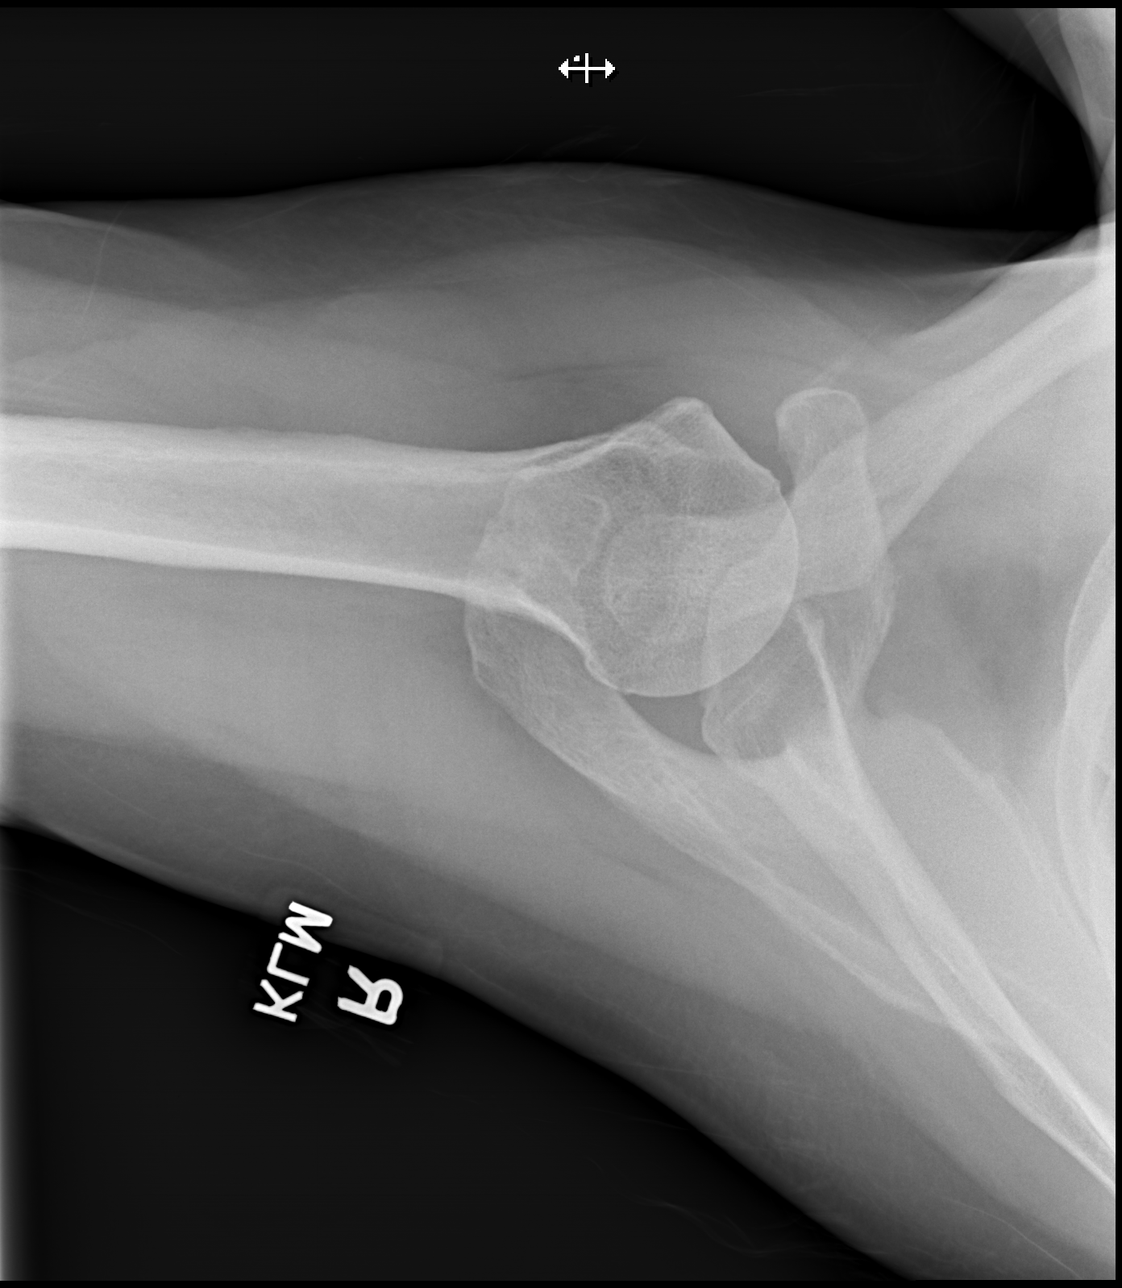

[3 of 3 positions shown; findings below may reference images not displayed]

FINDINGS: Right shoulder:

Bone mineralization is normal. There is no evidence of fracture or
dislocation. There is trace spurring of the AC joint. There is a
tiny calcification inferior to the lower lip of the glenoid process,
which could be a tiny chronic chip fracture or small loose body in
the axillary recess of the joint space.

Visualized portions of the right upper lung field are unremarkable.
Surrounding soft tissues are unremarkable.

Left shoulder:

There is normal bone mineralization without evidence of fractures or
dislocation. There is trace spurring at the AC joint, mild spurring
along the inferior glenohumeral joint with partial joint space loss
inferiorly.

On the axillary view there is a small calcification about the
greater tuberosity of the proximal humerus and a more ill-defined,
larger calcification approaching 9 mm adjacent the anterior aspect
of the proximal humeral metaphysis, not seen on the other views.

The visualized left upper lung field is unremarkable. Surrounding
soft tissues are unremarkable.
IMPRESSION: 1. Mild degenerative changes without evidence of fracture or
dislocation.
2. Tiny calcification inferior to the right lower glenoid lip, could
be a tiny chronic chip fracture or loose body in the joint space.
3. Dystrophic calcifications as described above on the left.

## 2023-12-23 DIAGNOSIS — M25512 Pain in left shoulder: Secondary | ICD-10-CM | POA: Diagnosis not present

## 2023-12-23 DIAGNOSIS — Z96611 Presence of right artificial shoulder joint: Secondary | ICD-10-CM | POA: Diagnosis not present

## 2023-12-23 DIAGNOSIS — Z471 Aftercare following joint replacement surgery: Secondary | ICD-10-CM | POA: Diagnosis not present

## 2023-12-23 LAB — GENECONNECT MOLECULAR SCREEN: Genetic Analysis Overall Interpretation: NEGATIVE

## 2023-12-30 DIAGNOSIS — E559 Vitamin D deficiency, unspecified: Secondary | ICD-10-CM | POA: Diagnosis not present

## 2023-12-30 DIAGNOSIS — E785 Hyperlipidemia, unspecified: Secondary | ICD-10-CM | POA: Diagnosis not present

## 2023-12-30 DIAGNOSIS — R5383 Other fatigue: Secondary | ICD-10-CM | POA: Diagnosis not present

## 2023-12-30 DIAGNOSIS — D649 Anemia, unspecified: Secondary | ICD-10-CM | POA: Diagnosis not present

## 2024-01-02 DIAGNOSIS — I10 Essential (primary) hypertension: Secondary | ICD-10-CM | POA: Diagnosis not present

## 2024-01-02 DIAGNOSIS — E782 Mixed hyperlipidemia: Secondary | ICD-10-CM | POA: Diagnosis not present

## 2024-01-02 DIAGNOSIS — E039 Hypothyroidism, unspecified: Secondary | ICD-10-CM | POA: Diagnosis not present

## 2024-01-02 DIAGNOSIS — D509 Iron deficiency anemia, unspecified: Secondary | ICD-10-CM | POA: Diagnosis not present

## 2024-01-18 DIAGNOSIS — I10 Essential (primary) hypertension: Secondary | ICD-10-CM | POA: Diagnosis not present

## 2024-01-18 DIAGNOSIS — J329 Chronic sinusitis, unspecified: Secondary | ICD-10-CM | POA: Diagnosis not present

## 2024-01-18 DIAGNOSIS — J449 Chronic obstructive pulmonary disease, unspecified: Secondary | ICD-10-CM | POA: Diagnosis not present

## 2024-01-18 DIAGNOSIS — B9689 Other specified bacterial agents as the cause of diseases classified elsewhere: Secondary | ICD-10-CM | POA: Diagnosis not present

## 2024-01-18 DIAGNOSIS — J069 Acute upper respiratory infection, unspecified: Secondary | ICD-10-CM | POA: Diagnosis not present

## 2024-01-29 DIAGNOSIS — H524 Presbyopia: Secondary | ICD-10-CM | POA: Diagnosis not present

## 2024-02-11 DIAGNOSIS — L720 Epidermal cyst: Secondary | ICD-10-CM | POA: Diagnosis not present

## 2024-02-11 DIAGNOSIS — L820 Inflamed seborrheic keratosis: Secondary | ICD-10-CM | POA: Diagnosis not present

## 2024-02-11 DIAGNOSIS — L570 Actinic keratosis: Secondary | ICD-10-CM | POA: Diagnosis not present

## 2024-02-13 ENCOUNTER — Encounter: Payer: Self-pay | Admitting: Nurse Practitioner

## 2024-02-13 DIAGNOSIS — K080 Exfoliation of teeth due to systemic causes: Secondary | ICD-10-CM | POA: Diagnosis not present

## 2024-03-04 ENCOUNTER — Encounter (HOSPITAL_COMMUNITY): Payer: Self-pay

## 2024-03-04 ENCOUNTER — Emergency Department (HOSPITAL_COMMUNITY)
Admission: EM | Admit: 2024-03-04 | Discharge: 2024-03-04 | Disposition: A | Discharge status: Home / Self Care | Attending: Emergency Medicine | Admitting: Emergency Medicine

## 2024-03-04 ENCOUNTER — Other Ambulatory Visit: Payer: Self-pay

## 2024-03-04 DIAGNOSIS — Z79899 Other long term (current) drug therapy: Secondary | ICD-10-CM | POA: Insufficient documentation

## 2024-03-04 DIAGNOSIS — E039 Hypothyroidism, unspecified: Secondary | ICD-10-CM | POA: Insufficient documentation

## 2024-03-04 DIAGNOSIS — K921 Melena: Secondary | ICD-10-CM | POA: Insufficient documentation

## 2024-03-04 DIAGNOSIS — R109 Unspecified abdominal pain: Secondary | ICD-10-CM | POA: Diagnosis not present

## 2024-03-04 DIAGNOSIS — Z96643 Presence of artificial hip joint, bilateral: Secondary | ICD-10-CM | POA: Diagnosis not present

## 2024-03-04 DIAGNOSIS — R197 Diarrhea, unspecified: Secondary | ICD-10-CM | POA: Insufficient documentation

## 2024-03-04 DIAGNOSIS — Z87891 Personal history of nicotine dependence: Secondary | ICD-10-CM | POA: Diagnosis not present

## 2024-03-04 LAB — CBC
HCT: 44.4 % (ref 36.0–46.0)
Hemoglobin: 15.1 g/dL — ABNORMAL HIGH (ref 12.0–15.0)
MCH: 33 pg (ref 26.0–34.0)
MCHC: 34 g/dL (ref 30.0–36.0)
MCV: 97.2 fL (ref 80.0–100.0)
Platelets: 256 10*3/uL (ref 150–400)
RBC: 4.57 MIL/uL (ref 3.87–5.11)
RDW: 12.6 % (ref 11.5–15.5)
WBC: 9 10*3/uL (ref 4.0–10.5)
nRBC: 0 % (ref 0.0–0.2)

## 2024-03-04 LAB — COMPREHENSIVE METABOLIC PANEL
ALT: 17 U/L (ref 0–44)
AST: 25 U/L (ref 15–41)
Albumin: 4 g/dL (ref 3.5–5.0)
Alkaline Phosphatase: 57 U/L (ref 38–126)
Anion gap: 11 (ref 5–15)
BUN: 19 mg/dL (ref 8–23)
CO2: 22 mmol/L (ref 22–32)
Calcium: 9.1 mg/dL (ref 8.9–10.3)
Chloride: 104 mmol/L (ref 98–111)
Creatinine, Ser: 0.85 mg/dL (ref 0.44–1.00)
GFR, Estimated: >60 mL/min (ref >60)
Glucose, Bld: 102 mg/dL — ABNORMAL HIGH (ref 70–99)
Potassium: 3.8 mmol/L (ref 3.5–5.1)
Sodium: 137 mmol/L (ref 135–145)
Total Bilirubin: 0.7 mg/dL (ref 0.0–1.2)
Total Protein: 6.6 g/dL (ref 6.5–8.1)

## 2024-03-04 LAB — URINALYSIS, ROUTINE W REFLEX MICROSCOPIC
Bilirubin Urine: NEGATIVE
Glucose, UA: NEGATIVE mg/dL
Hgb urine dipstick: NEGATIVE
Ketones, ur: NEGATIVE mg/dL
Leukocytes,Ua: NEGATIVE
Nitrite: NEGATIVE
Protein, ur: NEGATIVE mg/dL
Specific Gravity, Urine: 1.015 (ref 1.005–1.030)
pH: 5.5 (ref 5.0–8.0)

## 2024-03-04 LAB — LIPASE, BLOOD: Lipase: 34 U/L (ref 11–51)

## 2024-03-04 NOTE — ED Triage Notes (Signed)
 Pt reports bloody diarrhea and lower abdominal cramping, onset last night. Denies any other symptoms.

## 2024-03-04 NOTE — ED Provider Notes (Signed)
 [place] [facility] Provider Note   CSN: [id number] Arrival date & time: 03/04/24  1816     History  Chief Complaint  Patient presents with   Abdominal Pain    Stephanie Brennan is a 78 y.o. female.  Patient sent in from urgent care for rectal bleeding.  Patient did have Hemoccult positive stool there.  Patient stated that during the night she got diarrhea.  Did not notice any blood during the night but by morning she noticed some specks of blood.  Toilet water did not turn all red.  She has some crampy abdominal pain that has now resolved patient feels better.  Patient feels as if she is a little dehydrated.  Patient had no vomiting or nausea.  Patient has had no recent travel.  Patient does take nonsteroidals medicine like Advil but has done that for many years.  No significant increase.  No epigastric abdominal pain associated with it.  Patient thinks that she probably had a bug.  Patient has been seen in the past by Dr. Madilyn Hook of gastroenterology.  Patient is not on a blood thinner.  Has medical history significant hypothyroid migraine syndrome hysterectomy a right hip replacement left hip replacement.  And patient is a former smoker quit in 1980.  Patient since she has been here is had no further diarrhea.  And abdominal cramping has completely resolved.       Home Medications Prior to Admission medications   Medication Sig Start Date End Date Taking? Authorizing Provider  amLODipine (NORVASC) 10 MG tablet Take 1 tablet (10 mg total) by mouth daily. 10/17/22   Nori Riis, NP  Cholecalciferol (VITAMIN D3 PO) Take 1,000 Units by mouth daily.    [provider]  Coenzyme Q10 (COQ10) 100 MG CAPS Take 1 capsule by mouth daily.    [provider]  erythromycin (ERY-TAB) 250 MG EC tablet Take 250 mg by mouth as needed. Dental Procedures. Patient not taking: Reported on 10/31/2023    [provider]  estradiol  (ESTRACE) 0.1 MG/GM vaginal cream Place 1 Applicatorful vaginally.    [provider]  ibuprofen (ADVIL) 600 MG tablet Take 600 mg by mouth every 6 (six) hours as needed.    [provider]  magnesium oxide (MAG-OX) 400 (240 Mg) MG tablet Take 400 mg by mouth daily.    [provider]  metoprolol tartrate (LOPRESSOR) 25 MG tablet Take 0.5 tablets (12.5 mg total) by mouth 2 (two) times daily. 07/17/21 10/31/23  Patwardhan, Anabel Bene, MD  montelukast (SINGULAIR) 10 MG tablet Take 10 mg by mouth at bedtime.    [provider]  Omega-3 1000 MG CAPS Take 1 capsule by mouth daily.    [provider]  pravastatin (PRAVACHOL) 20 MG tablet Take 1 tablet (20 mg total) by mouth every evening. 10/31/23   Patwardhan, Anabel Bene, MD  SPIRIVA HANDIHALER 18 MCG inhalation capsule 1 capsule daily. 10/05/21   [provider]  SUMAtriptan (IMITREX) 100 MG tablet Take 100 mg by mouth as needed for migraine. May repeat in 2 hours if headache persists or recurs.    [provider]  Vitamins-Lipotropics (BALANCE B-100 PO) Take 1 capsule by mouth daily.    [provider]      Allergies    Ace inhibitors, Codeine, Flagyl [metronidazole], Penicillins, Statins, Sulfa antibiotics, Tetracyclines & related, and Zetia [ezetimibe]    Review of Systems   Review of Systems  Constitutional:  Negative  for chills and fever.  HENT:  Negative for ear pain and sore throat.   Eyes:  Negative for pain and visual disturbance.  Respiratory:  Negative for cough and shortness of breath.   Cardiovascular:  Negative for chest pain and palpitations.  Gastrointestinal:  Positive for abdominal pain, blood in stool and diarrhea. Negative for nausea and vomiting.  Genitourinary:  Negative for dysuria and hematuria.  Musculoskeletal:  Negative for arthralgias and back pain.  Skin:  Negative for color change and rash.  Neurological:  Negative for seizures and syncope.  All  other systems reviewed and are negative.   Physical Exam Updated Vital Signs BP (!) 174/71 (BP Location: Right Arm)   Pulse 70   Temp 97.7 F (36.5 C)   Resp 16   Ht 1.575 m (5\' 2" )   Wt 69.4 kg   SpO2 99%   BMI 27.98 kg/m  Physical Exam Vitals and nursing note reviewed.  Constitutional:      General: She is not in acute distress.    Appearance: Normal appearance. She is well-developed.  HENT:     Head: Normocephalic and atraumatic.     Mouth/Throat:     Mouth: Mucous membranes are dry.  Eyes:     Conjunctiva/sclera: Conjunctivae normal.     Pupils: Pupils are equal, round, and reactive to light.  Cardiovascular:     Rate and Rhythm: Normal rate and regular rhythm.     Heart sounds: No murmur heard. Pulmonary:     Effort: Pulmonary effort is normal. No respiratory distress.     Breath sounds: Normal breath sounds.  Abdominal:     General: There is no distension.     Palpations: Abdomen is soft.     Tenderness: There is no abdominal tenderness. There is no guarding.  Musculoskeletal:        General: No swelling.     Cervical back: Normal range of motion and neck supple.  Skin:    General: Skin is warm and dry.     Capillary Refill: Capillary refill takes less than 2 seconds.  Neurological:     General: No focal deficit present.     Mental Status: She is alert and oriented to person, place, and time.  Psychiatric:        Mood and Affect: Mood normal.     ED Results / Procedures / Treatments   Labs (all labs ordered are listed, but only abnormal results are displayed) Labs Reviewed  COMPREHENSIVE METABOLIC PANEL - Abnormal; Notable for the following components:      Result Value   Glucose, Bld 102 (*)    All other components within normal limits  CBC - Abnormal; Notable for the following components:   Hemoglobin 15.1 (*)    All other components within normal limits  LIPASE, BLOOD  URINALYSIS, ROUTINE W REFLEX MICROSCOPIC    EKG None  Radiology No  results found.  Procedures Procedures    Medications Ordered in ED Medications - No data to display  ED Course/ Medical Decision Making/ A&P                                 Medical Decision Making Amount and/or Complexity of Data Reviewed Labs: ordered.   Patient without significant amount of blood with the bowel movements.  Sounds as if there was a diarrhea component.  With some flecks of blood.  Patient has not been on  antibiotics recently.  Patient's had no recent travel.  Patient now feeling much better abdominal cramping has resolved no further bowel movement since being here and she arrived here at about 1800.  Labs lipase normal complete metabolic panel normal including liver function test renal function normal.  CBC no leukocytosis hemoglobin 15.1 platelets 256.  Urinalysis negative.  Based on patient feeling much better abdomen soft and nontender.  No significant amount of blood staining the toilet water all red.  Feel that patient probably had a viral process.  And could be discharged home.  On top of that her hemoglobin is more than normal.  Maybe a little hemoconcentrated.  Patient currently drinking water.  Does not have an IV so we will give IV fluids.  Patient has been followed by Je[facility] Hawking from gastroenterology in the past.  If things persist she can follow-up with them.  Otherwise she can follow back up with her primary care Dr. Maryelizabeth Rowan.  In addition patient is hemodynamically stable   Final Clinical Impression(s) / ED Diagnoses Final diagnoses:  Diarrhea, unspecified type  Blood in stool    Rx / DC Orders ED Discharge Orders     None         Vanetta Mulders, MD 03/04/24 2124

## 2024-03-04 NOTE — ED Notes (Signed)
 Pt states that she is going home now. Encouraged pt to wait for EDP's disposition but pt said she was feeling better & nothing was wrong with her. Pt & family member exited to the lobby. Pt ambulated with steady gait independently. EDP aware.

## 2024-03-04 NOTE — Discharge Instructions (Signed)
 Return for blood in the bowel movements to turn the toilet water all red x 2.  Follow-up with Dr. Elnoria Howard who saw you before from gastroenterology or your primary care doctor.  Recommend bland diet kind of the brat diet for the diarrhea.

## 2024-03-09 DIAGNOSIS — N958 Other specified menopausal and perimenopausal disorders: Secondary | ICD-10-CM | POA: Diagnosis not present

## 2024-03-12 DIAGNOSIS — H02729 Madarosis of unspecified eye, unspecified eyelid and periocular area: Secondary | ICD-10-CM | POA: Diagnosis not present

## 2024-03-12 DIAGNOSIS — L820 Inflamed seborrheic keratosis: Secondary | ICD-10-CM | POA: Diagnosis not present

## 2024-03-12 DIAGNOSIS — L570 Actinic keratosis: Secondary | ICD-10-CM | POA: Diagnosis not present

## 2024-03-26 DIAGNOSIS — G43E09 Chronic migraine with aura, not intractable, without status migrainosus: Secondary | ICD-10-CM | POA: Diagnosis not present

## 2024-03-26 DIAGNOSIS — E039 Hypothyroidism, unspecified: Secondary | ICD-10-CM | POA: Diagnosis not present

## 2024-03-26 DIAGNOSIS — G43909 Migraine, unspecified, not intractable, without status migrainosus: Secondary | ICD-10-CM | POA: Diagnosis not present

## 2024-03-26 DIAGNOSIS — I10 Essential (primary) hypertension: Secondary | ICD-10-CM | POA: Diagnosis not present

## 2024-04-30 ENCOUNTER — Other Ambulatory Visit: Payer: Self-pay | Admitting: Family Medicine

## 2024-04-30 DIAGNOSIS — Z1231 Encounter for screening mammogram for malignant neoplasm of breast: Secondary | ICD-10-CM

## 2024-05-25 DIAGNOSIS — E559 Vitamin D deficiency, unspecified: Secondary | ICD-10-CM | POA: Diagnosis not present

## 2024-05-25 DIAGNOSIS — E785 Hyperlipidemia, unspecified: Secondary | ICD-10-CM | POA: Diagnosis not present

## 2024-05-25 DIAGNOSIS — R5383 Other fatigue: Secondary | ICD-10-CM | POA: Diagnosis not present

## 2024-06-02 DIAGNOSIS — E039 Hypothyroidism, unspecified: Secondary | ICD-10-CM | POA: Diagnosis not present

## 2024-06-02 DIAGNOSIS — D509 Iron deficiency anemia, unspecified: Secondary | ICD-10-CM | POA: Diagnosis not present

## 2024-06-02 DIAGNOSIS — E782 Mixed hyperlipidemia: Secondary | ICD-10-CM | POA: Diagnosis not present

## 2024-06-02 DIAGNOSIS — E559 Vitamin D deficiency, unspecified: Secondary | ICD-10-CM | POA: Diagnosis not present

## 2024-06-03 ENCOUNTER — Ambulatory Visit
Admission: RE | Admit: 2024-06-03 | Discharge: 2024-06-03 | Disposition: A | Discharge status: Home / Self Care | Source: Ambulatory Visit

## 2024-06-03 DIAGNOSIS — Z1231 Encounter for screening mammogram for malignant neoplasm of breast: Secondary | ICD-10-CM | POA: Diagnosis not present

## 2024-06-27 DIAGNOSIS — N3000 Acute cystitis without hematuria: Secondary | ICD-10-CM | POA: Diagnosis not present

## 2024-07-16 DIAGNOSIS — Z1331 Encounter for screening for depression: Secondary | ICD-10-CM | POA: Diagnosis not present

## 2024-07-16 DIAGNOSIS — I10 Essential (primary) hypertension: Secondary | ICD-10-CM | POA: Diagnosis not present

## 2024-07-16 DIAGNOSIS — Z1339 Encounter for screening examination for other mental health and behavioral disorders: Secondary | ICD-10-CM | POA: Diagnosis not present

## 2024-07-16 DIAGNOSIS — G43909 Migraine, unspecified, not intractable, without status migrainosus: Secondary | ICD-10-CM | POA: Diagnosis not present

## 2024-07-16 DIAGNOSIS — Z0000 Encounter for general adult medical examination without abnormal findings: Secondary | ICD-10-CM | POA: Diagnosis not present

## 2024-08-05 DIAGNOSIS — Z471 Aftercare following joint replacement surgery: Secondary | ICD-10-CM | POA: Diagnosis not present

## 2024-08-05 DIAGNOSIS — M79644 Pain in right finger(s): Secondary | ICD-10-CM | POA: Diagnosis not present

## 2024-08-05 DIAGNOSIS — Z96611 Presence of right artificial shoulder joint: Secondary | ICD-10-CM | POA: Diagnosis not present

## 2024-08-12 DIAGNOSIS — M79641 Pain in right hand: Secondary | ICD-10-CM | POA: Diagnosis not present

## 2024-08-12 DIAGNOSIS — M1811 Unilateral primary osteoarthritis of first carpometacarpal joint, right hand: Secondary | ICD-10-CM | POA: Diagnosis not present

## 2024-08-12 DIAGNOSIS — G5601 Carpal tunnel syndrome, right upper limb: Secondary | ICD-10-CM | POA: Diagnosis not present

## 2024-08-18 DIAGNOSIS — G5601 Carpal tunnel syndrome, right upper limb: Secondary | ICD-10-CM | POA: Diagnosis not present

## 2024-08-20 DIAGNOSIS — G5601 Carpal tunnel syndrome, right upper limb: Secondary | ICD-10-CM | POA: Diagnosis not present

## 2024-08-20 DIAGNOSIS — K080 Exfoliation of teeth due to systemic causes: Secondary | ICD-10-CM | POA: Diagnosis not present

## 2024-08-25 DIAGNOSIS — L570 Actinic keratosis: Secondary | ICD-10-CM | POA: Diagnosis not present

## 2024-08-25 DIAGNOSIS — L814 Other melanin hyperpigmentation: Secondary | ICD-10-CM | POA: Diagnosis not present

## 2024-08-25 DIAGNOSIS — L821 Other seborrheic keratosis: Secondary | ICD-10-CM | POA: Diagnosis not present

## 2024-08-25 DIAGNOSIS — D225 Melanocytic nevi of trunk: Secondary | ICD-10-CM | POA: Diagnosis not present

## 2024-08-25 DIAGNOSIS — L578 Other skin changes due to chronic exposure to nonionizing radiation: Secondary | ICD-10-CM | POA: Diagnosis not present

## 2024-08-26 DIAGNOSIS — Z7185 Encounter for immunization safety counseling: Secondary | ICD-10-CM | POA: Diagnosis not present

## 2024-08-26 DIAGNOSIS — Z23 Encounter for immunization: Secondary | ICD-10-CM | POA: Diagnosis not present

## 2024-09-11 DIAGNOSIS — G5601 Carpal tunnel syndrome, right upper limb: Secondary | ICD-10-CM | POA: Diagnosis not present

## 2024-09-24 DIAGNOSIS — G5601 Carpal tunnel syndrome, right upper limb: Secondary | ICD-10-CM | POA: Diagnosis not present

## 2024-09-25 DIAGNOSIS — Z0000 Encounter for general adult medical examination without abnormal findings: Secondary | ICD-10-CM | POA: Diagnosis not present

## 2024-09-25 DIAGNOSIS — Z13220 Encounter for screening for lipoid disorders: Secondary | ICD-10-CM | POA: Diagnosis not present

## 2024-09-25 DIAGNOSIS — D649 Anemia, unspecified: Secondary | ICD-10-CM | POA: Diagnosis not present

## 2024-09-25 DIAGNOSIS — R5383 Other fatigue: Secondary | ICD-10-CM | POA: Diagnosis not present

## 2024-09-29 DIAGNOSIS — Z0000 Encounter for general adult medical examination without abnormal findings: Secondary | ICD-10-CM | POA: Diagnosis not present

## 2024-10-01 DIAGNOSIS — G43909 Migraine, unspecified, not intractable, without status migrainosus: Secondary | ICD-10-CM | POA: Diagnosis not present

## 2024-10-01 DIAGNOSIS — E039 Hypothyroidism, unspecified: Secondary | ICD-10-CM | POA: Diagnosis not present

## 2024-10-01 DIAGNOSIS — D509 Iron deficiency anemia, unspecified: Secondary | ICD-10-CM | POA: Diagnosis not present

## 2024-10-01 DIAGNOSIS — E782 Mixed hyperlipidemia: Secondary | ICD-10-CM | POA: Diagnosis not present

## 2024-10-01 DIAGNOSIS — J4540 Moderate persistent asthma, uncomplicated: Secondary | ICD-10-CM | POA: Diagnosis not present

## 2024-10-01 DIAGNOSIS — Z85828 Personal history of other malignant neoplasm of skin: Secondary | ICD-10-CM | POA: Diagnosis not present
